# Patient Record
Sex: Male | Born: 1955 | Race: White | Hispanic: No | Marital: Married | Smoking: Former smoker
Health system: Southern US, Community
[De-identification: ages and names within clinical notes are randomized; demographics above are authoritative.]

## PROBLEM LIST (undated history)

## (undated) DIAGNOSIS — T7840XA Allergy, unspecified, initial encounter: Secondary | ICD-10-CM

## (undated) HISTORY — DX: Allergy, unspecified, initial encounter: T78.40XA

## (undated) HISTORY — PX: HERNIA REPAIR: SHX51

---

## 1999-03-02 ENCOUNTER — Observation Stay (HOSPITAL_COMMUNITY): Admission: EM | Admit: 1999-03-02 | Discharge: 1999-03-02 | Payer: Self-pay

## 1999-03-02 ENCOUNTER — Encounter: Payer: Self-pay | Admitting: Emergency Medicine

## 2000-09-15 ENCOUNTER — Emergency Department (HOSPITAL_COMMUNITY): Admission: EM | Admit: 2000-09-15 | Discharge: 2000-09-15 | Payer: Self-pay | Admitting: Emergency Medicine

## 2010-07-19 ENCOUNTER — Emergency Department (HOSPITAL_COMMUNITY)
Admission: EM | Admit: 2010-07-19 | Discharge: 2010-07-19 | Payer: Self-pay | Source: Home / Self Care | Admitting: Emergency Medicine

## 2010-10-21 LAB — CBC
HCT: 41.5 % (ref 39.0–52.0)
Hemoglobin: 13.8 g/dL (ref 13.0–17.0)
MCH: 32.9 pg (ref 26.0–34.0)
MCHC: 33.3 g/dL (ref 30.0–36.0)
MCV: 99 fL (ref 78.0–100.0)
Platelets: 152 10*3/uL (ref 150–400)
RBC: 4.19 MIL/uL — ABNORMAL LOW (ref 4.22–5.81)
RDW: 13.1 % (ref 11.5–15.5)
WBC: 8.9 10*3/uL (ref 4.0–10.5)

## 2010-10-21 LAB — COMPREHENSIVE METABOLIC PANEL
ALT: 9 U/L (ref 0–53)
AST: 18 U/L (ref 0–37)
Albumin: 3.6 g/dL (ref 3.5–5.2)
Alkaline Phosphatase: 50 U/L (ref 39–117)
Potassium: 3.8 mEq/L (ref 3.5–5.1)
Sodium: 137 mEq/L (ref 135–145)
Total Protein: 6.3 g/dL (ref 6.0–8.3)

## 2010-10-21 LAB — DIFFERENTIAL
Basophils Relative: 0 % (ref 0–1)
Eosinophils Absolute: 0.3 10*3/uL (ref 0.0–0.7)
Eosinophils Relative: 3 % (ref 0–5)
Monocytes Absolute: 0.5 10*3/uL (ref 0.1–1.0)
Monocytes Relative: 6 % (ref 3–12)
Neutro Abs: 7 10*3/uL (ref 1.7–7.7)

## 2010-10-21 LAB — URINALYSIS, ROUTINE W REFLEX MICROSCOPIC
Bilirubin Urine: NEGATIVE
Nitrite: NEGATIVE
Specific Gravity, Urine: 1.025 (ref 1.005–1.030)
pH: 5.5 (ref 5.0–8.0)

## 2010-10-21 LAB — LIPASE, BLOOD: Lipase: 22 U/L (ref 11–59)

## 2010-10-21 LAB — SAMPLE TO BLOOD BANK

## 2010-10-21 LAB — HEMOCCULT GUIAC POC 1CARD (OFFICE): Fecal Occult Bld: POSITIVE

## 2011-09-20 ENCOUNTER — Ambulatory Visit (INDEPENDENT_AMBULATORY_CARE_PROVIDER_SITE_OTHER): Payer: BC Managed Care – PPO | Admitting: Physician Assistant

## 2011-09-20 DIAGNOSIS — J069 Acute upper respiratory infection, unspecified: Secondary | ICD-10-CM

## 2011-09-20 DIAGNOSIS — F17200 Nicotine dependence, unspecified, uncomplicated: Secondary | ICD-10-CM

## 2011-09-20 DIAGNOSIS — J301 Allergic rhinitis due to pollen: Secondary | ICD-10-CM

## 2011-09-20 DIAGNOSIS — J45909 Unspecified asthma, uncomplicated: Secondary | ICD-10-CM

## 2011-09-20 DIAGNOSIS — Z720 Tobacco use: Secondary | ICD-10-CM

## 2011-09-20 MED ORDER — ALBUTEROL SULFATE HFA 108 (90 BASE) MCG/ACT IN AERS: 2.0000 | INHALATION_SPRAY | Freq: Four times a day (QID) | RESPIRATORY_TRACT | Status: DC | PRN

## 2011-09-20 MED ORDER — VARENICLINE TARTRATE 0.5 MG PO TABS: ORAL_TABLET | ORAL | Status: DC

## 2011-09-20 MED ORDER — FLUTICASONE PROPIONATE 50 MCG/ACT NA SUSP: 2.0000 | Freq: Every day | NASAL | Status: DC

## 2011-09-20 MED ORDER — MOMETASONE FUROATE 220 MCG/INH IN AEPB: 2.0000 | INHALATION_SPRAY | Freq: Every day | RESPIRATORY_TRACT | Status: DC

## 2011-09-20 MED ORDER — AZITHROMYCIN 250 MG PO TABS: ORAL_TABLET | ORAL | Status: AC

## 2011-09-20 MED ORDER — GUAIFENESIN ER 1200 MG PO TB12: 1.0000 | ORAL_TABLET | Freq: Two times a day (BID) | ORAL | Status: DC | PRN

## 2011-09-20 MED ORDER — ALBUTEROL SULFATE (2.5 MG/3ML) 0.083% IN NEBU: 2.5000 mg | INHALATION_SOLUTION | Freq: Four times a day (QID) | RESPIRATORY_TRACT | Status: DC | PRN

## 2011-09-20 NOTE — Patient Instructions (Signed)
Take medications as prescribed.  Once you are well, continue the Asmanex and Flonase to help prevent the next episode.    Select your quit date at least 7 days in the future.  Start taking the Chantix, and continue smoking, until the quit date.  On the date, quit smoking and continue the CHantix.

## 2011-09-20 NOTE — Progress Notes (Signed)
Addended by: Fernande Bras on: 09/20/2011 11:12 AM   Modules accepted: Orders

## 2011-09-20 NOTE — Progress Notes (Signed)
  Subjective:    Patient ID: Craig Potts, male    DOB: 1955-11-07, 56 y.o.   MRN: [id number]  HPI The patient presents with illness since yesterday. He complains of sinus congestion drainage and cough. He has a history of allergic rhinitis and asthma. Has run out of both Asmanex and Ventolin inhalers. He continues to smoke, is ready to quit.  Note ear pain or pressure. Mild sore throat. No fever or chills. No GI or GU complaints.  His primary concern is that this will "develop into bronchitis. When I get bronchitis I often end up in the hospital with pneumonia."   Review of Systems As above, otherwise negative.    Objective:   Physical Exam  Vitals reviewed. Constitutional: He is oriented to person, place, and time. Vital signs are normal. He appears well-developed and well-nourished. No distress.  HENT:  Head: Normocephalic and atraumatic.  Right Ear: Hearing normal.  Left Ear: Hearing normal.  Eyes: EOM are normal. Pupils are equal, round, and reactive to light.  Neck: Normal range of motion. Neck supple. No thyromegaly present.  Cardiovascular: Normal rate, regular rhythm and normal heart sounds.   Pulses:      Radial pulses are 2+ on the right side, and 2+ on the left side.  Pulmonary/Chest: Effort normal and breath sounds normal.  Lymphadenopathy:       Head (right side): No tonsillar, no preauricular, no posterior auricular and no occipital adenopathy present.       Head (left side): No tonsillar, no preauricular, no posterior auricular and no occipital adenopathy present.    He has no cervical adenopathy.       Right: No supraclavicular adenopathy present.       Left: No supraclavicular adenopathy present.  Neurological: He is alert and oriented to person, place, and time. No sensory deficit.  Skin: Skin is warm, dry and intact. No rash noted. No cyanosis or erythema. Nails show no clubbing.  Psychiatric: He has a normal mood and affect.      Assessment & Plan:  1.  URI. While it is very likely his illness is viral, he is at increased risk due to his smoking. Z-Pak. Guaifenesin. When necessary albuterol.  2. Asthma. Back on Asmanex. Albuterol when necessary for rescue. Encouraged him to stay on his maintenance product  And not run out.  3. Allergic rhinitis. Flonase as above.  4. Tobacco abuse. He is ready to quit. Prescription for Chantix.

## 2012-01-31 ENCOUNTER — Ambulatory Visit (INDEPENDENT_AMBULATORY_CARE_PROVIDER_SITE_OTHER): Payer: BC Managed Care – PPO | Admitting: Emergency Medicine

## 2012-01-31 VITALS — BP 120/70 | HR 78 | Temp 98.6°F | Resp 16 | Ht 70.0 in | Wt 154.0 lb

## 2012-01-31 DIAGNOSIS — J029 Acute pharyngitis, unspecified: Secondary | ICD-10-CM

## 2012-01-31 DIAGNOSIS — Z720 Tobacco use: Secondary | ICD-10-CM

## 2012-01-31 DIAGNOSIS — J218 Acute bronchiolitis due to other specified organisms: Secondary | ICD-10-CM

## 2012-01-31 DIAGNOSIS — J018 Other acute sinusitis: Secondary | ICD-10-CM

## 2012-01-31 DIAGNOSIS — J40 Bronchitis, not specified as acute or chronic: Secondary | ICD-10-CM

## 2012-01-31 MED ORDER — AMOXICILLIN-POT CLAVULANATE 875-125 MG PO TABS: 1.0000 | ORAL_TABLET | Freq: Two times a day (BID) | ORAL | Status: AC

## 2012-01-31 MED ORDER — VARENICLINE TARTRATE 0.5 MG PO TABS: ORAL_TABLET | ORAL | Status: DC

## 2012-01-31 MED ORDER — MOMETASONE FUROATE 220 MCG/INH IN AEPB: 2.0000 | INHALATION_SPRAY | Freq: Every day | RESPIRATORY_TRACT | Status: DC

## 2012-01-31 MED ORDER — HYDROCOD POLST-CHLORPHEN POLST 10-8 MG/5ML PO LQCR: 5.0000 mL | Freq: Two times a day (BID) | ORAL | Status: DC | PRN

## 2012-01-31 NOTE — Progress Notes (Signed)
     Patient Name: Craig Potts Date of Birth: 04-25-1956 Medical Record Number: [id number] Gender: male Date of Encounter: 01/31/2012  History of Present Illness:  [name] is a 56 y.o. very pleasant male patient who presents with the following:  Recurrent infection.  Has marked post nasal drainage and a foul taste in mouth.  Profuse nasal discharge and congestion.  Cough not productive.  No fever or chills.  No nausea or vomiting   There is no problem list on file for this patient.  Past Medical History  Diagnosis Date  . Allergy   . Asthma    Past Surgical History  Procedure Date  . Hernia repair    History  Substance Use Topics  . Smoking status: Current Everyday Smoker -- 1.0 packs/day for 10 years    Types: Cigarettes  . Smokeless tobacco: Not on file  . Alcohol Use: Not on file   Family History  Problem Relation Age of Onset  . Stroke Father    No Known Allergies  Medication list has been reviewed and updated.  Prior to Admission medications   Medication Sig Start Date End Date Taking? Authorizing Provider  albuterol (PROVENTIL HFA;VENTOLIN HFA) 108 (90 BASE) MCG/ACT inhaler Inhale 2 puffs into the lungs every 6 (six) hours as needed for wheezing. 09/20/11 09/19/12 Yes Chelle S Eulis Salazar, PA-C  fluticasone (FLONASE) 50 MCG/ACT nasal spray Place 2 sprays into the nose daily. 09/20/11 09/19/12 Yes Chelle S Traycen Goyer, PA-C  mometasone (ASMANEX 60 METERED DOSES) 220 MCG/INH inhaler Inhale 2 puffs into the lungs daily. 09/20/11 09/19/12 Yes Chelle S Leyda Vanderwerf, PA-C  Guaifenesin (MUCINEX MAXIMUM STRENGTH) 1200 MG TB12 Take 1 tablet (1,200 mg total) by mouth every 12 (twelve) hours as needed. 09/20/11   Chelle Tessa Lerner, PA-C  varenicline (CHANTIX) 0.5 MG tablet Take as directed. 09/20/11   Fernande Bras, PA-C    Review of Systems:  As per HPI, otherwise negative.    Physical Examination: Filed Vitals:   01/31/12 1123  BP: 120/70  Pulse: 78  Temp: 98.6 F (37 C)    Resp: 16   Filed Vitals:   01/31/12 1123  Height: 5\' 10"  (1.778 m)  Weight: 154 lb (69.854 kg)   Body mass index is 22.10 kg/(m^2). Ideal Body Weight: Weight in (lb) to have BMI = 25: 173.9   GEN: WDWN, NAD, Non-toxic, A & O x 3 HEENT: Atraumatic, Normocephalic. Neck supple. No masses, No LAD. Ears and Nose: No external deformity. CV: RRR, No M/G/R. No JVD. No thrill. No extra heart sounds. PULM: CTA B, crackles, rhonchi. No retractions. No resp. distress. No accessory muscle use.  Diffuse wheezing ABD: S, NT, ND, +BS. No rebound. No HSM. EXTR: No c/c/e NEURO Normal gait.  PSYCH: Normally interactive. Conversant. Not depressed or anxious appearing.  Calm demeanor.    Assessment and Plan: Bronchitis Sinusitis Stop smoking   chantix augmentin  Carmelina Dane, MD

## 2012-04-02 ENCOUNTER — Other Ambulatory Visit: Payer: Self-pay | Admitting: Physician Assistant

## 2013-06-01 ENCOUNTER — Ambulatory Visit: Payer: BC Managed Care – PPO

## 2013-06-01 ENCOUNTER — Ambulatory Visit (INDEPENDENT_AMBULATORY_CARE_PROVIDER_SITE_OTHER): Payer: BC Managed Care – PPO | Admitting: Internal Medicine

## 2013-06-01 VITALS — BP 104/62 | HR 60 | Temp 98.7°F | Resp 12 | Ht 69.5 in | Wt 150.4 lb

## 2013-06-01 DIAGNOSIS — M79609 Pain in unspecified limb: Secondary | ICD-10-CM

## 2013-06-01 DIAGNOSIS — J441 Chronic obstructive pulmonary disease with (acute) exacerbation: Secondary | ICD-10-CM

## 2013-06-01 DIAGNOSIS — Z23 Encounter for immunization: Secondary | ICD-10-CM

## 2013-06-01 DIAGNOSIS — S61002A Unspecified open wound of left thumb without damage to nail, initial encounter: Secondary | ICD-10-CM

## 2013-06-01 DIAGNOSIS — M79645 Pain in left finger(s): Secondary | ICD-10-CM

## 2013-06-01 DIAGNOSIS — F17200 Nicotine dependence, unspecified, uncomplicated: Secondary | ICD-10-CM

## 2013-06-01 DIAGNOSIS — S61209A Unspecified open wound of unspecified finger without damage to nail, initial encounter: Secondary | ICD-10-CM

## 2013-06-01 DIAGNOSIS — J218 Acute bronchiolitis due to other specified organisms: Secondary | ICD-10-CM

## 2013-06-01 MED ORDER — MOMETASONE FUROATE 220 MCG/INH IN AEPB: 2.0000 | INHALATION_SPRAY | Freq: Every day | RESPIRATORY_TRACT | Status: DC

## 2013-06-01 MED ORDER — FLUTICASONE PROPIONATE 50 MCG/ACT NA SUSP: 2.0000 | Freq: Every day | NASAL | Status: AC

## 2013-06-01 MED ORDER — VARENICLINE TARTRATE 0.5 MG PO TABS: 0.5000 mg | ORAL_TABLET | Freq: Two times a day (BID) | ORAL | Status: DC

## 2013-06-01 MED ORDER — ALBUTEROL SULFATE HFA 108 (90 BASE) MCG/ACT IN AERS: 2.0000 | INHALATION_SPRAY | Freq: Four times a day (QID) | RESPIRATORY_TRACT | Status: DC

## 2013-06-01 MED ORDER — CEPHALEXIN 500 MG PO CAPS: 500.0000 mg | ORAL_CAPSULE | Freq: Two times a day (BID) | ORAL | Status: DC

## 2013-06-01 NOTE — Patient Instructions (Addendum)
WOUND CARE Please return in 7 days to have your stitches/staples removed or sooner if you have concerns. Marland Kitchen Keep area clean and dry for 24 hours. Do not remove bandage, if applied. . After 24 hours, remove bandage and wash wound gently with mild soap and warm water. Reapply a new bandage after cleaning wound, if directed. . Continue daily cleansing with soap and water until stitches/staples are removed. . Do not apply any ointments or creams to the wound while stitches/staples are in place, as this may cause delayed healing. . Notify the office if you experience any of the following signs of infection: Swelling, redness, pus drainage, streaking, fever >101.0 F . Notify the office if you experience excessive bleeding that does not stop after 15-20 minutes of constant, firm pressure.  Smoking Cessation Quitting smoking is important to your health and has many advantages. However, it is not always easy to quit since nicotine is a very addictive drug. Often times, people try 3 times or more before being able to quit. This document explains the best ways for you to prepare to quit smoking. Quitting takes hard work and a lot of effort, but you can do it. ADVANTAGES OF QUITTING SMOKING  You will live longer, feel better, and live better.  Your body will feel the impact of quitting smoking almost immediately.  Within 20 minutes, blood pressure decreases. Your pulse returns to its normal level.  After 8 hours, carbon monoxide levels in the blood return to normal. Your oxygen level increases.  After 24 hours, the chance of having a heart attack starts to decrease. Your breath, hair, and body stop smelling like smoke.  After 48 hours, damaged nerve endings begin to recover. Your sense of taste and smell improve.  After 72 hours, the body is virtually free of nicotine. Your bronchial tubes relax and breathing becomes easier.  After 2 to 12 weeks, lungs can hold more air. Exercise becomes  easier and circulation improves.  The risk of having a heart attack, stroke, cancer, or lung disease is greatly reduced.  After 1 year, the risk of coronary heart disease is cut in half.  After 5 years, the risk of stroke falls to the same as a nonsmoker.  After 10 years, the risk of lung cancer is cut in half and the risk of other cancers decreases significantly.  After 15 years, the risk of coronary heart disease drops, usually to the level of a nonsmoker.  If you are pregnant, quitting smoking will improve your chances of having a healthy baby.  The people you live with, especially any children, will be healthier.  You will have extra money to spend on things other than cigarettes. QUESTIONS TO THINK ABOUT BEFORE ATTEMPTING TO QUIT You may want to talk about your answers with your caregiver.  Why do you want to quit?  If you tried to quit in the past, what helped and what did not?  What will be the most difficult situations for you after you quit? How will you plan to handle them?  Who can help you through the tough times? Your family? Friends? A caregiver?  What pleasures do you get from smoking? What ways can you still get pleasure if you quit? Here are some questions to ask your caregiver:  How can you help me to be successful at quitting?  What medicine do you think would be best for me and how should I take it?  What should I do if I need more  help?  What is smoking withdrawal like? How can I get information on withdrawal? GET READY  Set a quit date.  Change your environment by getting rid of all cigarettes, ashtrays, matches, and lighters in your home, car, or work. Do not let people smoke in your home.  Review your past attempts to quit. Think about what worked and what did not. GET SUPPORT AND ENCOURAGEMENT You have a better chance of being successful if you have help. You can get support in many ways.  Tell your family, friends, and co-workers that you are  going to quit and need their support. Ask them not to smoke around you.  Get individual, group, or telephone counseling and support. Programs are available at [employer] and health centers. Call your local health department for information about programs in your area.  Spiritual beliefs and practices may help some smokers quit.  Download a "quit meter" on your computer to keep track of quit statistics, such as how long you have gone without smoking, cigarettes not smoked, and money saved.  Get a self-help book about quitting smoking and staying off of tobacco. LEARN NEW SKILLS AND BEHAVIORS  Distract yourself from urges to smoke. Talk to someone, go for a walk, or occupy your time with a task.  Change your normal routine. Take a different route to work. Drink tea instead of coffee. Eat breakfast in a different place.  Reduce your stress. Take a hot bath, exercise, or read a book.  Plan something enjoyable to do every day. Reward yourself for not smoking.  Explore interactive web-based programs that specialize in helping you quit. GET MEDICINE AND USE IT CORRECTLY Medicines can help you stop smoking and decrease the urge to smoke. Combining medicine with the above behavioral methods and support can greatly increase your chances of successfully quitting smoking.  Nicotine replacement therapy helps deliver nicotine to your body without the negative effects and risks of smoking. Nicotine replacement therapy includes nicotine gum, lozenges, inhalers, nasal sprays, and skin patches. Some may be available over-the-counter and others require a prescription.  Antidepressant medicine helps people abstain from smoking, but how this works is unknown. This medicine is available by prescription.  Nicotinic receptor partial agonist medicine simulates the effect of nicotine in your brain. This medicine is available by prescription. Ask your caregiver for advice about which medicines to use and how  to use them based on your health history. Your caregiver will tell you what side effects to look out for if you choose to be on a medicine or therapy. Carefully read the information on the package. Do not use any other product containing nicotine while using a nicotine replacement product.  RELAPSE OR DIFFICULT SITUATIONS Most relapses occur within the first 3 months after quitting. Do not be discouraged if you start smoking again. Remember, most people try several times before finally quitting. You may have symptoms of withdrawal because your body is used to nicotine. You may crave cigarettes, be irritable, feel very hungry, cough often, get headaches, or have difficulty concentrating. The withdrawal symptoms are only temporary. They are strongest when you first quit, but they will go away within 10 14 days. To reduce the chances of relapse, try to:  Avoid drinking alcohol. Drinking lowers your chances of successfully quitting.  Reduce the amount of caffeine you consume. Once you quit smoking, the amount of caffeine in your body increases and can give you symptoms, such as a rapid heartbeat, sweating, and anxiety.  Avoid smokers  because they can make you want to smoke.  Do not let weight gain distract you. Many smokers will gain weight when they quit, usually less than 10 pounds. Eat a healthy diet and stay active. You can always lose the weight gained after you quit.  Find ways to improve your mood other than smoking. FOR MORE INFORMATION  www.smokefree.gov  Document Released: 07/21/2001 Document Revised: 01/26/2012 Document Reviewed: 11/05/2011 [facility] Patient Information 2014 [place], [place].

## 2013-06-01 NOTE — Progress Notes (Signed)
  Subjective:    Patient ID: Craig Potts, male    DOB: 1956-08-06, 57 y.o.   MRN: [id number]  HPI At home and cut dorsal left thumb on metal edge. Mild pain, no function loss, no purulence seen.  Need tetanus up date. Wants to quit smoking, has asthma,   Review of Systems     Objective:   Physical Exam  Vitals reviewed. Constitutional: He is oriented to person, place, and time. He appears well-developed and well-nourished. No distress.  HENT:  Head: Normocephalic.  Eyes: EOM are normal. No scleral icterus.  Pulmonary/Chest: Effort normal.  Musculoskeletal:       Right hand: Normal.       Left hand: He exhibits tenderness, bony tenderness and laceration. He exhibits normal range of motion, normal two-point discrimination, normal capillary refill, no deformity and no swelling. Normal sensation noted. Normal strength noted. He exhibits no thumb/finger opposition.       Hands: No pain with resisted thumb extention NMVS intact  Neurological: He is alert and oriented to person, place, and time. He has normal strength. No cranial nerve deficit or sensory deficit.  Skin: Ecchymosis and laceration noted. Rash is not pustular.   [facility] reading (PRIMARY) by  Dr Perrin Maltese no bony injury or FB seen.  Wound repair by Ms. Egan PAc.         Assessment & Plan:  Wound care/F/up Quit smoking plan/Chantix best trial for him

## 2013-06-01 NOTE — Progress Notes (Signed)
Procedure Note: Verbal consent obtained from the patient.  Local anesthesia with 1 cc Lidocaine 1% with epinephrine.  Wound scrubbed with soap and water.  Wound explored.  No foreign bodies or deep structure injury noted.  Wound closed with #5 sutures of 5-0 ethilon (#3 HM, #2 SI).  Area cleansed and dressed.  Pt tolerated very well.  Discussed wound care.  Anticipate suture removal in 5 days.

## 2013-06-10 ENCOUNTER — Ambulatory Visit (INDEPENDENT_AMBULATORY_CARE_PROVIDER_SITE_OTHER): Payer: BC Managed Care – PPO | Admitting: Physician Assistant

## 2013-06-10 VITALS — BP 108/74 | HR 64 | Temp 98.1°F | Resp 16 | Ht 69.5 in | Wt 150.0 lb

## 2013-06-10 DIAGNOSIS — S61402D Unspecified open wound of left hand, subsequent encounter: Secondary | ICD-10-CM

## 2013-06-10 DIAGNOSIS — Z5189 Encounter for other specified aftercare: Secondary | ICD-10-CM

## 2013-06-10 NOTE — Progress Notes (Signed)
  Subjective:    Patient ID: Craig Potts, male    DOB: 1955-10-16, 57 y.o.   MRN: [id number]  Suture / Staple Removal   57 year old male presents for suture removal. DOI 06/01/13. Wound is of left thumb. Doing well without issues or complaints. Denies erythema, warmth, or drainage.     Review of Systems  Constitutional: Negative for fever and chills.  Gastrointestinal: Negative for nausea and vomiting.  Skin: Positive for wound. Negative for color change.       Objective:   Physical Exam  Constitutional: He is oriented to person, place, and time. He appears well-developed and well-nourished.  HENT:  Head: Normocephalic and atraumatic.  Right Ear: External ear normal.  Left Ear: External ear normal.  Cardiovascular: Normal rate.   Pulmonary/Chest: Effort normal.  Neurological: He is alert and oriented to person, place, and time.  Skin:  Wound on left thumb well healing without erythema, warmth, or drainage.   Psychiatric: He has a normal mood and affect. His behavior is normal. Judgment and thought content normal.    Sutures removed without difficulty      Assessment & Plan:  Wound, open, hand with or without fingers with complication, left, subsequent encounter  Wound well healing Follow up as needed.

## 2013-08-01 ENCOUNTER — Other Ambulatory Visit: Payer: Self-pay

## 2013-08-01 DIAGNOSIS — J441 Chronic obstructive pulmonary disease with (acute) exacerbation: Secondary | ICD-10-CM

## 2013-08-01 DIAGNOSIS — J218 Acute bronchiolitis due to other specified organisms: Secondary | ICD-10-CM

## 2013-08-01 DIAGNOSIS — S61002A Unspecified open wound of left thumb without damage to nail, initial encounter: Secondary | ICD-10-CM

## 2013-08-01 DIAGNOSIS — M79645 Pain in left finger(s): Secondary | ICD-10-CM

## 2013-08-01 DIAGNOSIS — Z23 Encounter for immunization: Secondary | ICD-10-CM

## 2013-08-01 MED ORDER — MOMETASONE FUROATE 220 MCG/INH IN AEPB: 2.0000 | INHALATION_SPRAY | Freq: Every day | RESPIRATORY_TRACT | Status: DC

## 2013-08-01 NOTE — Telephone Encounter (Signed)
Pharm reqs 90 day supply.

## 2013-08-31 ENCOUNTER — Other Ambulatory Visit: Payer: Self-pay | Admitting: Internal Medicine

## 2016-10-09 ENCOUNTER — Ambulatory Visit: Payer: BLUE CROSS/BLUE SHIELD | Admitting: Diagnostic Neuroimaging

## 2019-10-27 ENCOUNTER — Ambulatory Visit: Payer: Self-pay | Attending: Internal Medicine

## 2019-10-27 DIAGNOSIS — Z23 Encounter for immunization: Secondary | ICD-10-CM

## 2019-10-27 NOTE — Progress Notes (Signed)
   Covid-19 Vaccination Clinic  Name:  Craig Potts    MRN: [id number] DOB: 08/02/1956  10/27/2019  Craig Potts was observed post Covid-19 immunization for 15 minutes without incident. He was provided with Vaccine Information Sheet and instruction to access the V-Safe system.   Craig Potts was instructed to call 911 with any severe reactions post vaccine: Marland Kitchen Difficulty breathing  . Swelling of face and throat  . A fast heartbeat  . A bad rash all over body  . Dizziness and weakness   Immunizations Administered    Name Date Dose VIS Date Route   Pfizer COVID-19 Vaccine 10/27/2019 10:41 AM 0.3 mL 07/21/2019 Intramuscular   Manufacturer: [employer], [employer]   Lot: [id number]   NDC: [address]-9

## 2019-11-21 ENCOUNTER — Ambulatory Visit: Payer: Self-pay | Attending: Internal Medicine

## 2019-11-21 DIAGNOSIS — Z23 Encounter for immunization: Secondary | ICD-10-CM

## 2019-11-21 NOTE — Progress Notes (Signed)
   Covid-19 Vaccination Clinic  Name:  Craig Potts    MRN: [id number] DOB: 12-15-1955  11/21/2019  Craig Potts was observed post Covid-19 immunization for 15 minutes without incident. He was provided with Vaccine Information Sheet and instruction to access the V-Safe system.   Craig Potts was instructed to call 911 with any severe reactions post vaccine: Marland Kitchen Difficulty breathing  . Swelling of face and throat  . A fast heartbeat  . A bad rash all over body  . Dizziness and weakness   Immunizations Administered    Name Date Dose VIS Date Route   Pfizer COVID-19 Vaccine 11/21/2019 11:44 AM 0.3 mL 07/21/2019 Intramuscular   Manufacturer: [employer], [employer]   Lot: [id number]   NDC: [address]-8

## 2020-01-18 ENCOUNTER — Other Ambulatory Visit: Payer: Self-pay | Admitting: Internal Medicine

## 2020-01-18 DIAGNOSIS — F17200 Nicotine dependence, unspecified, uncomplicated: Secondary | ICD-10-CM

## 2020-02-05 ENCOUNTER — Ambulatory Visit
Admission: RE | Admit: 2020-02-05 | Discharge: 2020-02-05 | Disposition: A | Discharge status: Home / Self Care | Payer: Self-pay | Source: Ambulatory Visit | Attending: Internal Medicine | Admitting: Internal Medicine

## 2020-02-05 DIAGNOSIS — F17200 Nicotine dependence, unspecified, uncomplicated: Secondary | ICD-10-CM

## 2020-03-26 ENCOUNTER — Ambulatory Visit: Payer: Self-pay | Admitting: Cardiology

## 2020-04-16 ENCOUNTER — Ambulatory Visit: Payer: Self-pay | Admitting: Cardiology

## 2020-05-16 ENCOUNTER — Ambulatory Visit: Payer: BC Managed Care – PPO

## 2020-05-16 ENCOUNTER — Other Ambulatory Visit: Payer: Self-pay

## 2020-05-16 ENCOUNTER — Ambulatory Visit: Payer: BC Managed Care – PPO | Admitting: Cardiology

## 2020-05-16 ENCOUNTER — Encounter: Payer: Self-pay | Admitting: Cardiology

## 2020-05-16 VITALS — BP 117/72 | HR 96 | Resp 16 | Ht 70.0 in | Wt 183.0 lb

## 2020-05-16 DIAGNOSIS — I4891 Unspecified atrial fibrillation: Secondary | ICD-10-CM | POA: Insufficient documentation

## 2020-05-16 DIAGNOSIS — I480 Paroxysmal atrial fibrillation: Secondary | ICD-10-CM

## 2020-05-16 DIAGNOSIS — I2510 Atherosclerotic heart disease of native coronary artery without angina pectoris: Secondary | ICD-10-CM

## 2020-05-16 NOTE — Progress Notes (Signed)
Patient referred by [name], MD for atrial fibtillation  Subjective:   Craig Potts, male    DOB: [birth year], 64 y.o.   MRN: [id number]   Chief Complaint  Patient presents with   Coronary Artery Disease   Atrial Fibrillation   New Patient (Initial Visit)     HPI  63 y.o. Caucasian male with new diagnosis atrial fibrillation.  Patient is a retired Administrator.  He has a long history of tobacco and alcohol dependence, now has been sober-quit smoking in 2014 and alcohol in September 2021.  At baseline, he does not have any chest pain, shortness of breath symptoms with regular walking activity.  She weeks ago, he got winded while playing ping-pong.  This led to a PCP visit, the EKG showed A. fib.  He also underwent CT chest for lung cancer screening, which showed coronary atherosclerosis.  Currently, patient does not have any symptoms.  Past Medical History:  Diagnosis Date   Allergy    Asthma      Past Surgical History:  Procedure Laterality Date   HERNIA REPAIR       Social History   Tobacco Use  Smoking Status Former Smoker   Packs/day: 2.00   Years: 50.00   Pack years: 100.00   Types: Cigarettes   Quit date: 2014   Years since quitting: 7.7  Smokeless Tobacco Never Used  .  Social History   Substance and Sexual Activity  Alcohol Use No     Family History  Problem Relation Age of Onset   Stroke Father      Current Outpatient Medications on File Prior to Visit  Medication Sig Dispense Refill   fluticasone (FLONASE) 50 MCG/ACT nasal spray Place 2 sprays into the nose daily. 16 g 6   PROAIR HFA 108 (90 BASE) MCG/ACT inhaler INHALE 2 PUFFS INTO THE LUNGS 4 (FOUR) TIMES DAILY. 3 Inhaler 0   doxycycline (PERIOSTAT) 20 MG tablet Take 1 tablet by mouth 2 (two) times daily.     mometasone (ASMANEX 60 METERED DOSES) 220 MCG/INH inhaler Inhale 2 puffs into the lungs daily. 3 Inhaler 2   QC LO-DOSE ASPIRIN 81 MG EC tablet daily.      rosuvastatin (CRESTOR) 5 MG tablet Take 5 mg by mouth at bedtime.     traZODone (DESYREL) 100 MG tablet Take 100-150 mg by mouth at bedtime as needed.     triamcinolone cream (KENALOG) 0.1 % Apply 1 application topically 2 (two) times daily.     No current facility-administered medications on file prior to visit.    Cardiovascular and other pertinent studies:  EKG 05/16/2020: Sinus rhythm 98 bpm  RSR(V1) -nondiagnostic  EKG 05/13/2020: Atrial fibrillation 100 bpm  EKG 01/18/2020: Sinus rhythm 60 bpm.  Frequent PACs.  CT chest lung cancer screening 02/05/2020: 1. Lung-RADS 3S, probably benign findings. Short-term follow-up in 6 months is recommended with repeat low-dose chest CT without contrast (please use the following order, "CT CHEST LCS NODULE FOLLOW-UP W/O CM"). 2. The "S" modifier above refers to potentially clinically significant non lung cancer related findings. Specifically, there is aortic atherosclerosis, in addition to left anterior descending coronary artery disease. Please note that although the presence of coronary artery calcium documents the presence of coronary artery disease, the severity of this disease and any potential stenosis cannot be assessed on this non-gated CT examination. Assessment for potential risk factor modification, dietary therapy or pharmacologic therapy may be warranted, if clinically indicated. 3. Mild diffuse bronchial  wall thickening with very mild centrilobular and paraseptal emphysema; imaging findings suggestive of underlying COPD.   Recent labs: 03/27/2020: Glucose 79, BUN/Cr 6/0.8. EGFR 93. HbA1C N/A Chol 141, TG 75, HDL 49, LDL 77 TSH 1.0 normal    Review of Systems  Cardiovascular: Positive for dyspnea on exertion. Negative for chest pain, leg swelling, palpitations and syncope.         Vitals:   05/16/20 1358  BP: 117/72  Pulse: 96  Resp: 16  SpO2: 97%     Body mass index is 26.26 kg/m. Filed Weights    05/16/20 1358  Weight: 183 lb (83 kg)     Objective:   Physical Exam Vitals and nursing note reviewed.  Constitutional:      General: He is not in acute distress. Neck:     Vascular: No JVD.  Cardiovascular:     Rate and Rhythm: Normal rate and regular rhythm.     Heart sounds: Normal heart sounds. No murmur heard.   Pulmonary:     Effort: Pulmonary effort is normal.     Breath sounds: Normal breath sounds. No wheezing or rales.         Assessment & Recommendations:   64 y.o. Caucasian male with prior h/o tobacco and alcohol dependence, coronary atherosclerosis (CT Chest 03/2020), paroxysmal atrial fibrillation.  Paroxysmal Afib: Currently in sinus rhythm CHA2DS2VASc score 1 t the most-considering coronary atherosclerosis, annual stroke risk 0.6%. Recommend Aspirin 81 mg daily for now for coronary atherosclerosis I will obtain exercise nuclear stress test and echocardiogram. I will also obtain cardiac telemetry monitoring to assess fib burden. After that, will discuss rhythm control therapy.  Coronary atherosclerosis: No angina symptoms at this time. Dyspnea symptoms probably related to Afib. Stress test will evaluate for ischemia.  Continue Aspirin, staitn  Thank you for referring the patient to [place]. Please feel free to contact with any questions.   Nigel Mormon, MD Pager: [phone] Office: [phone]

## 2020-05-22 ENCOUNTER — Ambulatory Visit: Payer: BC Managed Care – PPO

## 2020-05-22 ENCOUNTER — Other Ambulatory Visit: Payer: Self-pay

## 2020-05-22 DIAGNOSIS — I480 Paroxysmal atrial fibrillation: Secondary | ICD-10-CM

## 2020-05-31 ENCOUNTER — Other Ambulatory Visit (HOSPITAL_COMMUNITY)
Admission: RE | Admit: 2020-05-31 | Discharge: 2020-05-31 | Disposition: A | Discharge status: Home / Self Care | Payer: BC Managed Care – PPO | Source: Ambulatory Visit | Attending: Cardiology | Admitting: Cardiology

## 2020-05-31 DIAGNOSIS — Z20822 Contact with and (suspected) exposure to covid-19: Secondary | ICD-10-CM | POA: Insufficient documentation

## 2020-05-31 DIAGNOSIS — Z01812 Encounter for preprocedural laboratory examination: Secondary | ICD-10-CM | POA: Diagnosis present

## 2020-05-31 LAB — SARS CORONAVIRUS 2 (TAT 6-24 HRS): SARS Coronavirus 2: NEGATIVE

## 2020-06-03 ENCOUNTER — Other Ambulatory Visit: Payer: Self-pay

## 2020-06-03 ENCOUNTER — Ambulatory Visit: Payer: BC Managed Care – PPO

## 2020-06-03 DIAGNOSIS — I480 Paroxysmal atrial fibrillation: Secondary | ICD-10-CM

## 2020-06-08 ENCOUNTER — Ambulatory Visit: Payer: BC Managed Care – PPO | Attending: Internal Medicine

## 2020-06-08 DIAGNOSIS — Z23 Encounter for immunization: Secondary | ICD-10-CM

## 2020-06-08 NOTE — Progress Notes (Signed)
   Covid-19 Vaccination Clinic  Name:  Craig Potts    MRN: [id number] DOB: 18-Nov-1955  06/08/2020  [name] was observed post Covid-19 immunization for 15 minutes without incident. He was provided with Vaccine Information Sheet and instruction to access the V-Safe system.   [name] was instructed to call 911 with any severe reactions post vaccine: Marland Kitchen Difficulty breathing  . Swelling of face and throat  . A fast heartbeat  . A bad rash all over body  . Dizziness and weakness

## 2020-06-14 ENCOUNTER — Encounter: Payer: Self-pay | Admitting: Cardiology

## 2020-06-14 ENCOUNTER — Ambulatory Visit: Payer: BC Managed Care – PPO | Admitting: Cardiology

## 2020-06-14 ENCOUNTER — Other Ambulatory Visit: Payer: Self-pay

## 2020-06-14 VITALS — BP 108/68 | HR 75 | Resp 16 | Ht 70.0 in | Wt 183.0 lb

## 2020-06-14 DIAGNOSIS — I2510 Atherosclerotic heart disease of native coronary artery without angina pectoris: Secondary | ICD-10-CM

## 2020-06-14 DIAGNOSIS — I480 Paroxysmal atrial fibrillation: Secondary | ICD-10-CM

## 2020-06-14 NOTE — Progress Notes (Signed)
Patient referred by [name], MD for atrial fibtillation  Subjective:   Craig Potts, male    DOB: 03/30/1956, 64 y.o.   MRN: [id number]   Chief Complaint  Patient presents with  . Paroxysmal atrial fibrillation  . Follow-up    4 week     HPI  64 y.o. Caucasian male with prior h/o tobacco and alcohol dependence, coronary atherosclerosis (CT Chest 03/2020), symptomatic paroxysmal atrial fibrillation.  Patient is doing well, and has only occasional shortness of breath.  Initial consultation HPI 05/2020: Patient is a retired Administrator.  He has a long history of tobacco and alcohol dependence, now has been sober-quit smoking in 2014 and alcohol in September 2021.  At baseline, he does not have any chest pain, shortness of breath symptoms with regular walking activity.  She weeks ago, he got winded while playing ping-pong.  This led to a PCP visit, the EKG showed A. fib.  He also underwent CT chest for lung cancer screening, which showed coronary atherosclerosis.  Currently, patient does not have any symptoms.    Current Outpatient Medications on File Prior to Visit  Medication Sig Dispense Refill  . doxycycline (PERIOSTAT) 20 MG tablet Take 1 tablet by mouth 2 (two) times daily.    . fluticasone (FLONASE) 50 MCG/ACT nasal spray Place 2 sprays into the nose daily. 16 g 6  . mometasone (ASMANEX 60 METERED DOSES) 220 MCG/INH inhaler Inhale 2 puffs into the lungs daily. 3 Inhaler 2  . PROAIR HFA 108 (90 BASE) MCG/ACT inhaler INHALE 2 PUFFS INTO THE LUNGS 4 (FOUR) TIMES DAILY. 3 Inhaler 0  . QC LO-DOSE ASPIRIN 81 MG EC tablet daily.    . rosuvastatin (CRESTOR) 5 MG tablet Take 5 mg by mouth at bedtime.    . traZODone (DESYREL) 100 MG tablet Take 100-150 mg by mouth at bedtime as needed.    . triamcinolone cream (KENALOG) 0.1 % Apply 1 application topically 2 (two) times daily.     No current facility-administered medications on file prior to visit.    Cardiovascular and  other pertinent studies:  Exercise Sestamibi Stress Test 06/03/2020: Normal ECG stress. The patient exercised for 4 minutes and 59 seconds of a Bruce protocol, achieving approximately 7 METs.  Exercise capacity is reduced.  Myocardial perfusion is normal. Mildly enlarged left ventricle in both rest and stress.  TID is normal. Stress LV EF is normal 67%.  No previous exam available for comparison. Low risk.   Echocardiogram 05/17/2020:  1. Left ventricle cavity is normal in size and wall thickness. Normal  global wall motion. Normal LV systolic function with EF 59%. Normal  diastolic filling pattern.  2. Moderate (Grade II) mitral regurgitation.  3. Mild tricuspid regurgitation.  4. No evidence of pulmonary hypertension  Mobile cardiac telemetry 13 days 05/16/2020 - 05/30/2020: Dominant rhythm: Sinus HR 197-69 bpm. Avg HR 42 bpm. 1.1% isolated SVE, <1% couplet/triplets. 1 episode of NSVT at 197 bpm for 5 beats <1% isolated VE, couplet/triplets. Atrial fibrillation/atrial flutter occurred for 1%, HR 66-174 bpm. Longest episode lasted for 56 min 12 sec, avg HR 111 bpm. 4 patient triggered events correlated with Afib/flutter was detected within 45 seconds of patient symptoms No SVT/high grade AV block, sinus pause >3sec noted.  EKG 05/16/2020: Sinus rhythm 98 bpm  RSR(V1) -nondiagnostic  EKG 05/13/2020: Atrial fibrillation 100 bpm  EKG 01/18/2020: Sinus rhythm 60 bpm.  Frequent PACs.  CT chest lung cancer screening 02/05/2020: 1. Lung-RADS 3S, probably benign  findings. Short-term follow-up in 6 months is recommended with repeat low-dose chest CT without contrast (please use the following order, "CT CHEST LCS NODULE FOLLOW-UP W/O CM"). 2. The "S" modifier above refers to potentially clinically significant non lung cancer related findings. Specifically, there is aortic atherosclerosis, in addition to left anterior descending coronary artery disease. Please note that although the  presence of coronary artery calcium documents the presence of coronary artery disease, the severity of this disease and any potential stenosis cannot be assessed on this non-gated CT examination. Assessment for potential risk factor modification, dietary therapy or pharmacologic therapy may be warranted, if clinically indicated. 3. Mild diffuse bronchial wall thickening with very mild centrilobular and paraseptal emphysema; imaging findings suggestive of underlying COPD.   Recent labs: 03/27/2020: Glucose 79, BUN/Cr 6/0.8. EGFR 93. HbA1C N/A Chol 141, TG 75, HDL 49, LDL 77 TSH 1.0 normal    Review of Systems  Cardiovascular: Positive for dyspnea on exertion. Negative for chest pain, leg swelling, palpitations and syncope.         Vitals:   06/14/20 1334  BP: 108/68  Pulse: 75  Resp: 16  SpO2: 98%     Body mass index is 26.26 kg/m. Filed Weights   06/14/20 1334  Weight: 183 lb (83 kg)     Objective:   Physical Exam Vitals and nursing note reviewed.  Constitutional:      General: He is not in acute distress. Neck:     Vascular: No JVD.  Cardiovascular:     Rate and Rhythm: Normal rate and regular rhythm.     Heart sounds: Normal heart sounds. No murmur heard.   Pulmonary:     Effort: Pulmonary effort is normal.     Breath sounds: Normal breath sounds. No wheezing or rales.         Assessment & Recommendations:   64 y.o. Caucasian male with prior h/o tobacco and alcohol dependence, coronary atherosclerosis (CT Chest 03/2020), symptomatic paroxysmal atrial fibrillation.  Paroxysmal Afib: Currently in sinus rhythm CHA2DS2VASc score 1at the most-considering coronary atherosclerosis, annual stroke risk 0.6%. Recommend Aspirin 81 mg daily for now for coronary atherosclerosis. No ischemia on stress test.  1% Afib burden We discussed rhythm control versus rate control strategy.  Given relatively infrequent symptoms associated with A. fib, he wants to hold  off any rhythm control strategy at this time.  Coronary atherosclerosis: No angina symptoms at this time. No ischemia on stress testing. Continue Aspirin, staitn  Follow-up in 6 months   Nigel Mormon, MD Pager: [phone] Office: [phone]

## 2020-08-20 ENCOUNTER — Other Ambulatory Visit: Payer: Self-pay | Admitting: Internal Medicine

## 2020-08-20 DIAGNOSIS — R918 Other nonspecific abnormal finding of lung field: Secondary | ICD-10-CM

## 2020-09-05 ENCOUNTER — Ambulatory Visit: Payer: BC Managed Care – PPO

## 2020-09-24 ENCOUNTER — Ambulatory Visit
Admission: RE | Admit: 2020-09-24 | Discharge: 2020-09-24 | Disposition: A | Discharge status: Home / Self Care | Payer: BC Managed Care – PPO | Source: Ambulatory Visit | Attending: Internal Medicine | Admitting: Internal Medicine

## 2020-09-24 DIAGNOSIS — R918 Other nonspecific abnormal finding of lung field: Secondary | ICD-10-CM

## 2020-12-13 ENCOUNTER — Ambulatory Visit: Payer: BC Managed Care – PPO | Admitting: Cardiology

## 2020-12-20 ENCOUNTER — Ambulatory Visit: Payer: BC Managed Care – PPO | Admitting: Cardiology

## 2020-12-30 ENCOUNTER — Encounter: Payer: Self-pay | Admitting: Cardiology

## 2020-12-30 ENCOUNTER — Other Ambulatory Visit: Payer: Self-pay

## 2020-12-30 ENCOUNTER — Ambulatory Visit: Payer: BC Managed Care – PPO | Admitting: Cardiology

## 2020-12-30 VITALS — BP 115/73 | HR 55 | Temp 98.5°F | Ht 70.0 in | Wt 185.0 lb

## 2020-12-30 DIAGNOSIS — I2510 Atherosclerotic heart disease of native coronary artery without angina pectoris: Secondary | ICD-10-CM

## 2020-12-30 DIAGNOSIS — I480 Paroxysmal atrial fibrillation: Secondary | ICD-10-CM

## 2020-12-30 NOTE — Progress Notes (Signed)
Patient referred by [name], MD for atrial fibtillation  Subjective:   Craig Potts, male    DOB: [birth year], 65 y.o.   MRN: [id number]   Chief Complaint  Patient presents with  . Atrial Fibrillation  . Follow-up     HPI  65 y.o. Caucasian male with prior h/o tobacco and alcohol dependence, coronary atherosclerosis (CT Chest 03/2020), symptomatic paroxysmal atrial fibrillation.  Patient is doing well, and has only occasional shortness of breath-like he had while walking up and down the hill for 20 min at a car race recently. Denies any chest pain.   Initial consultation HPI 05/2020: Patient is a retired Administrator.  He has a long history of tobacco and alcohol dependence, now has been sober-quit smoking in 2014 and alcohol in September 2021.  At baseline, he does not have any chest pain, shortness of breath symptoms with regular walking activity.  She weeks ago, he got winded while playing ping-pong.  This led to a PCP visit, the EKG showed A. fib.  He also underwent CT chest for lung cancer screening, which showed coronary atherosclerosis.  Currently, patient does not have any symptoms.    Current Outpatient Medications on File Prior to Visit  Medication Sig Dispense Refill  . doxycycline (PERIOSTAT) 20 MG tablet Take 1 tablet by mouth 2 (two) times daily.    . fluticasone (FLONASE) 50 MCG/ACT nasal spray Place 2 sprays into the nose daily. 16 g 6  . mometasone (ASMANEX 60 METERED DOSES) 220 MCG/INH inhaler Inhale 2 puffs into the lungs daily. 3 Inhaler 2  . PROAIR HFA 108 (90 BASE) MCG/ACT inhaler INHALE 2 PUFFS INTO THE LUNGS 4 (FOUR) TIMES DAILY. 3 Inhaler 0  . QC LO-DOSE ASPIRIN 81 MG EC tablet daily.    . rosuvastatin (CRESTOR) 5 MG tablet Take 5 mg by mouth at bedtime.    . traZODone (DESYREL) 100 MG tablet Take 100-150 mg by mouth at bedtime as needed.    . triamcinolone cream (KENALOG) 0.1 % Apply 1 application topically 2 (two) times daily.     No current  facility-administered medications on file prior to visit.    Cardiovascular and other pertinent studies:  EKG 12/30/2020: Sinus rhythm 53 bpm RSR(V1) -nondiagnostic  Exercise Sestamibi Stress Test 06/03/2020: Normal ECG stress. The patient exercised for 4 minutes and 59 seconds of a Bruce protocol, achieving approximately 7 METs.  Exercise capacity is reduced.  Myocardial perfusion is normal. Mildly enlarged left ventricle in both rest and stress.  TID is normal. Stress LV EF is normal 67%.  No previous exam available for comparison. Low risk.   Echocardiogram 05/17/2020:  1. Left ventricle cavity is normal in size and wall thickness. Normal  global wall motion. Normal LV systolic function with EF 59%. Normal  diastolic filling pattern.  2. Moderate (Grade II) mitral regurgitation.  3. Mild tricuspid regurgitation.  4. No evidence of pulmonary hypertension  Mobile cardiac telemetry 13 days 05/16/2020 - 05/30/2020: Dominant rhythm: Sinus HR 197-69 bpm. Avg HR 42 bpm. 1.1% isolated SVE, <1% couplet/triplets. 1 episode of NSVT at 197 bpm for 5 beats <1% isolated VE, couplet/triplets. Atrial fibrillation/atrial flutter occurred for 1%, HR 66-174 bpm. Longest episode lasted for 56 min 12 sec, avg HR 111 bpm. 4 patient triggered events correlated with Afib/flutter was detected within 45 seconds of patient symptoms No SVT/high grade AV block, sinus pause >3sec noted.  EKG 05/16/2020: Sinus rhythm 98 bpm  RSR(V1) -nondiagnostic  EKG 05/13/2020: Atrial  fibrillation 100 bpm  EKG 01/18/2020: Sinus rhythm 60 bpm.  Frequent PACs.  CT chest lung cancer screening 02/05/2020: 1. Lung-RADS 3S, probably benign findings. Short-term follow-up in 6 months is recommended with repeat low-dose chest CT without contrast (please use the following order, "CT CHEST LCS NODULE FOLLOW-UP W/O CM"). 2. The "S" modifier above refers to potentially clinically significant non lung cancer related findings.  Specifically, there is aortic atherosclerosis, in addition to left anterior descending coronary artery disease. Please note that although the presence of coronary artery calcium documents the presence of coronary artery disease, the severity of this disease and any potential stenosis cannot be assessed on this non-gated CT examination. Assessment for potential risk factor modification, dietary therapy or pharmacologic therapy may be warranted, if clinically indicated. 3. Mild diffuse bronchial wall thickening with very mild centrilobular and paraseptal emphysema; imaging findings suggestive of underlying COPD.   Recent labs: 03/27/2020: Glucose 79, BUN/Cr 6/0.8. EGFR 93. HbA1C N/A Chol 141, TG 75, HDL 49, LDL 77 TSH 1.0 normal    Review of Systems  Cardiovascular: Positive for dyspnea on exertion. Negative for chest pain, leg swelling, palpitations and syncope.         Vitals:   12/30/20 1048  BP: 115/73  Pulse: (!) 55  Temp: 98.5 F (36.9 C)  SpO2: 98%     Body mass index is 26.54 kg/m. Filed Weights   12/30/20 1048  Weight: 185 lb (83.9 kg)     Objective:   Physical Exam Vitals and nursing note reviewed.  Constitutional:      General: He is not in acute distress. Neck:     Vascular: No JVD.  Cardiovascular:     Rate and Rhythm: Normal rate and regular rhythm.     Heart sounds: Normal heart sounds. No murmur heard.   Pulmonary:     Effort: Pulmonary effort is normal.     Breath sounds: Normal breath sounds. No wheezing or rales.         Assessment & Recommendations:   65 y.o. Caucasian male with prior h/o tobacco and alcohol dependence, coronary atherosclerosis (CT Chest 03/2020), symptomatic paroxysmal atrial fibrillation.  Paroxysmal Afib: Currently in sinus rhythm CHA2DS2VASc score 1at the most-considering coronary atherosclerosis, annual stroke risk 0.6%. Recommend Aspirin 81 mg daily for now for coronary atherosclerosis. No ischemia on  stress test.  1% Afib burden Given relatively infrequent symptoms associated with A. fib, he wants to hold off any rhythm control strategy at this time.  Coronary atherosclerosis: No angina symptoms at this time. No ischemia on stress testing. Occasional dyspnea likely due to h/o smoking and possible COPD. Continue Aspirin, staitn  F/u in 6 months   Nigel Mormon, MD Pager: [phone] Office: [phone]

## 2021-01-20 DIAGNOSIS — R5383 Other fatigue: Secondary | ICD-10-CM | POA: Diagnosis not present

## 2021-01-20 DIAGNOSIS — Z0000 Encounter for general adult medical examination without abnormal findings: Secondary | ICD-10-CM | POA: Diagnosis not present

## 2021-01-20 DIAGNOSIS — R799 Abnormal finding of blood chemistry, unspecified: Secondary | ICD-10-CM | POA: Diagnosis not present

## 2021-01-20 DIAGNOSIS — E7800 Pure hypercholesterolemia, unspecified: Secondary | ICD-10-CM | POA: Diagnosis not present

## 2021-01-27 DIAGNOSIS — Z024 Encounter for examination for driving license: Secondary | ICD-10-CM | POA: Diagnosis not present

## 2021-01-27 DIAGNOSIS — Z0000 Encounter for general adult medical examination without abnormal findings: Secondary | ICD-10-CM | POA: Diagnosis not present

## 2021-01-27 DIAGNOSIS — E7800 Pure hypercholesterolemia, unspecified: Secondary | ICD-10-CM | POA: Diagnosis not present

## 2021-01-27 DIAGNOSIS — J45909 Unspecified asthma, uncomplicated: Secondary | ICD-10-CM | POA: Diagnosis not present

## 2021-05-27 DIAGNOSIS — Z23 Encounter for immunization: Secondary | ICD-10-CM | POA: Diagnosis not present

## 2021-06-24 DIAGNOSIS — R350 Frequency of micturition: Secondary | ICD-10-CM | POA: Diagnosis not present

## 2021-06-30 ENCOUNTER — Ambulatory Visit: Payer: Self-pay | Admitting: Cardiology

## 2021-06-30 ENCOUNTER — Encounter: Payer: Self-pay | Admitting: Cardiology

## 2021-06-30 ENCOUNTER — Other Ambulatory Visit: Payer: Self-pay

## 2021-06-30 VITALS — BP 100/61 | HR 60 | Temp 98.0°F | Resp 16 | Ht 70.0 in | Wt 168.0 lb

## 2021-06-30 DIAGNOSIS — I480 Paroxysmal atrial fibrillation: Secondary | ICD-10-CM | POA: Diagnosis not present

## 2021-06-30 DIAGNOSIS — I2510 Atherosclerotic heart disease of native coronary artery without angina pectoris: Secondary | ICD-10-CM | POA: Diagnosis not present

## 2021-06-30 MED ORDER — ROSUVASTATIN CALCIUM 10 MG PO TABS
10.0000 mg | ORAL_TABLET | Freq: Every day | ORAL | 3 refills | Status: DC
Start: 2021-06-30 — End: 2022-06-29

## 2021-06-30 NOTE — Progress Notes (Signed)
Patient referred by [name], MD for atrial fibtillation  Subjective:   Craig Potts, male    DOB: 09-12-1955, 65 y.o.   MRN: [id number]   Chief Complaint  Patient presents with   Atrial Fibrillation   Follow-up    6 month   Coronary Artery Disease     HPI  65 y.o. Caucasian male with prior h/o tobacco and alcohol dependence, coronary atherosclerosis (CT Chest 03/2020), symptomatic paroxysmal atrial fibrillation.  Patient is doing well, denies chest pain, shortness of breath, palpitations, leg edema, orthopnea, PND, TIA/syncope.  Initial consultation HPI 05/2020: Patient is a retired Administrator.  He has a long history of tobacco and alcohol dependence, now has been sober-quit smoking in 2014 and alcohol in September 2021.  At baseline, he does not have any chest pain, shortness of breath symptoms with regular walking activity.  She weeks ago, he got winded while playing ping-pong.  This led to a PCP visit, the EKG showed A. fib.  He also underwent CT chest for lung cancer screening, which showed coronary atherosclerosis.  Currently, patient does not have any symptoms.    Current Outpatient Medications on File Prior to Visit  Medication Sig Dispense Refill   doxycycline (PERIOSTAT) 20 MG tablet Take 1 tablet by mouth 2 (two) times daily.     fluticasone (FLONASE) 50 MCG/ACT nasal spray Place 2 sprays into the nose daily. 16 g 6   PROAIR HFA 108 (90 BASE) MCG/ACT inhaler INHALE 2 PUFFS INTO THE LUNGS 4 (FOUR) TIMES DAILY. 3 Inhaler 0   QC LO-DOSE ASPIRIN 81 MG EC tablet daily.     rosuvastatin (CRESTOR) 5 MG tablet Take 5 mg by mouth at bedtime.     traZODone (DESYREL) 100 MG tablet Take 100-150 mg by mouth at bedtime as needed.     triamcinolone cream (KENALOG) 0.1 % Apply 1 application topically 2 (two) times daily.     No current facility-administered medications on file prior to visit.    Cardiovascular and other pertinent studies:  EKG 06/30/2021: Sinus rhythm  66 bpm Normal EKG  Exercise Sestamibi Stress Test 06/03/2020: Normal ECG stress. The patient exercised for 4 minutes and 59 seconds of a Bruce protocol, achieving approximately 7 METs.  Exercise capacity is reduced.  Myocardial perfusion is normal. Mildly enlarged left ventricle in both rest and stress.  TID is normal. Stress LV EF is normal 67%.  No previous exam available for comparison. Low risk.   Echocardiogram 05/17/2020:  1. Left ventricle cavity is normal in size and wall thickness. Normal  global wall motion. Normal LV systolic function with EF 59%. Normal  diastolic filling pattern.  2. Moderate (Grade II) mitral regurgitation.  3. Mild tricuspid regurgitation.  4. No evidence of pulmonary hypertension  Mobile cardiac telemetry 13 days 05/16/2020 - 05/30/2020: Dominant rhythm: Sinus HR 197-69 bpm. Avg HR 42 bpm. 1.1% isolated SVE, <1% couplet/triplets. 1 episode of NSVT at 197 bpm for 5 beats <1% isolated VE, couplet/triplets. Atrial fibrillation/atrial flutter occurred for 1%, HR 66-174 bpm. Longest episode lasted for 56 min 12 sec, avg HR 111 bpm. 4 patient triggered events correlated with Afib/flutter was detected within 45 seconds of patient symptoms No SVT/high grade AV block, sinus pause >3sec noted.  EKG 05/16/2020: Sinus rhythm 98 bpm  RSR(V1) -nondiagnostic  EKG 05/13/2020: Atrial fibrillation 100 bpm  EKG 01/18/2020: Sinus rhythm 60 bpm.  Frequent PACs.  CT chest lung cancer screening 02/05/2020: 1. Lung-RADS 3S, probably benign findings. Short-term  follow-up in 6 months is recommended with repeat low-dose chest CT without contrast (please use the following order, "CT CHEST LCS NODULE FOLLOW-UP W/O CM"). 2. The "S" modifier above refers to potentially clinically significant non lung cancer related findings. Specifically, there is aortic atherosclerosis, in addition to left anterior descending coronary artery disease. Please note that although the presence  of coronary artery calcium documents the presence of coronary artery disease, the severity of this disease and any potential stenosis cannot be assessed on this non-gated CT examination. Assessment for potential risk factor modification, dietary therapy or pharmacologic therapy may be warranted, if clinically indicated. 3. Mild diffuse bronchial wall thickening with very mild centrilobular and paraseptal emphysema; imaging findings suggestive of underlying COPD.    Recent labs: 01/20/2021: Chol 185, TG 104, HDL 38, LDL 77  03/27/2020: Glucose 79, BUN/Cr 6/0.8. EGFR 93. HbA1C N/A Chol 141, TG 75, HDL 49, LDL 77 TSH 1.0 normal    Review of Systems  Cardiovascular:  Positive for dyspnea on exertion. Negative for chest pain, leg swelling, palpitations and syncope.        Vitals:   06/30/21 1317  BP: 100/61  Pulse: 60  Resp: 16  Temp: 98 F (36.7 C)  SpO2: 98%     Body mass index is 24.11 kg/m. Filed Weights   06/30/21 1317  Weight: 168 lb (76.2 kg)     Objective:   Physical Exam Vitals and nursing note reviewed.  Constitutional:      General: He is not in acute distress. Neck:     Vascular: No JVD.  Cardiovascular:     Rate and Rhythm: Normal rate and regular rhythm.     Heart sounds: Normal heart sounds. No murmur heard. Pulmonary:     Effort: Pulmonary effort is normal.     Breath sounds: Normal breath sounds. No wheezing or rales.        Assessment & Recommendations:   65 y.o. Caucasian male with prior h/o tobacco and alcohol dependence, coronary atherosclerosis (CT Chest 03/2020), symptomatic paroxysmal atrial fibrillation.  Paroxysmal Afib: Currently in sinus rhythm CHA2DS2VASc score 1at the most-considering coronary atherosclerosis, annual stroke risk 0.6%. Recommend Aspirin 81 mg daily for now for coronary atherosclerosis. No ischemia on stress test.  1% Afib burden Given relatively infrequent symptoms associated with A. fib, he wants to hold  off any rhythm control strategy at this time.  Coronary atherosclerosis: No angina symptoms at this time. No ischemia on stress testing. Occasional dyspnea likely due to h/o smoking and possible COPD. Continue Aspirin, staitn Increased Crestor to 10 mg, goal LDL <70  F/u in 1 year   Nigel Mormon, MD Pager: [phone] Office: [phone]

## 2021-12-14 IMAGING — CT CT CHEST LUNG CANCER SCREENING LOW DOSE W/O CM
2 of 5 series · 15 of 40 positions shown, 18 images · non-contrast
Comparison: No priors.

CLINICAL DATA: 64-year-old male former smoker (quit 7 years ago)
with 60 pack-year history of smoking. Lung cancer screening
examination.

EXAM:
CT CHEST WITHOUT CONTRAST LOW-DOSE FOR LUNG CANCER SCREENING
TECHNIQUE: Multidetector CT imaging of the chest was performed following the
standard protocol without IV contrast.

[Series 4: lung 1.00 br44 cor · coronal · 0.63mm/px · 3 of 253 slices shown]
[im 51/253  lung]
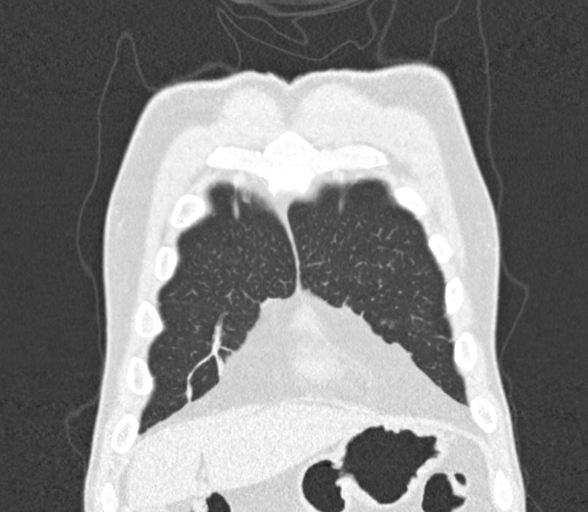
[im 101/253  lung]
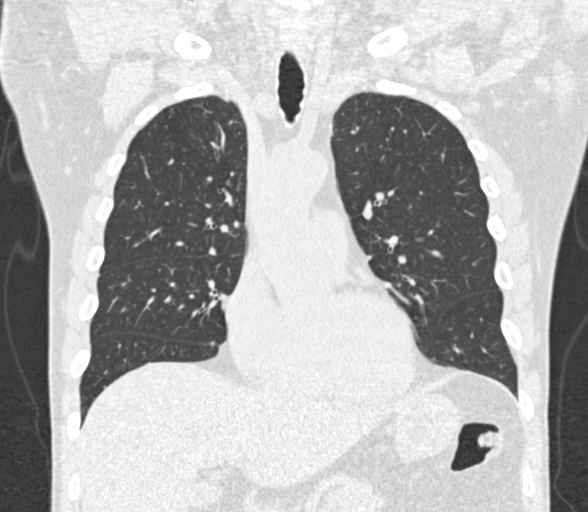
[im 152/253  lung]
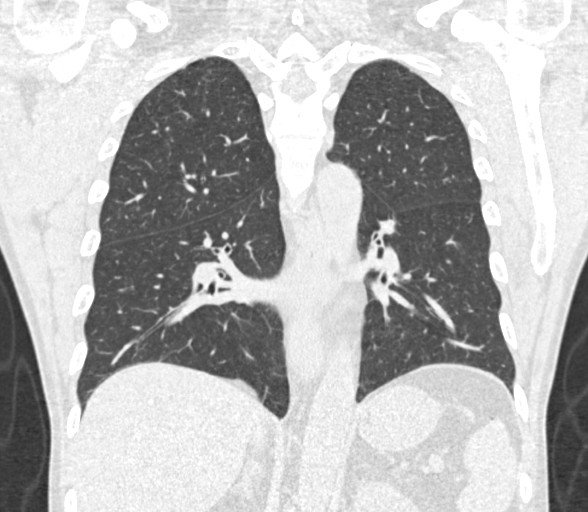

[Series 9: lung 1.00 br60 axial · axial · 0.72mm/px · z∈[-1142,-854]mm · 12 of 320 slices shown, 15 images]
[im 16/320  mediastinal]
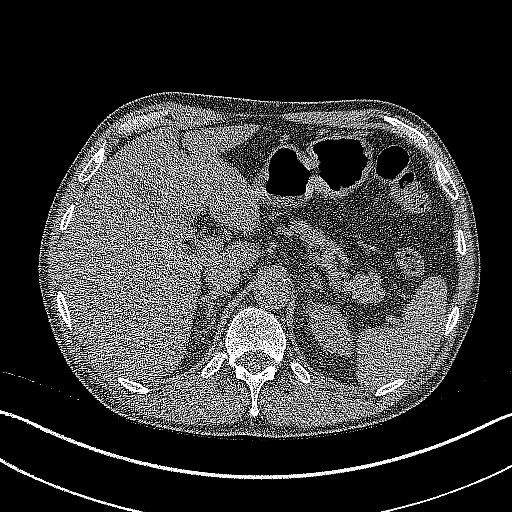
[im 16/320  lung]
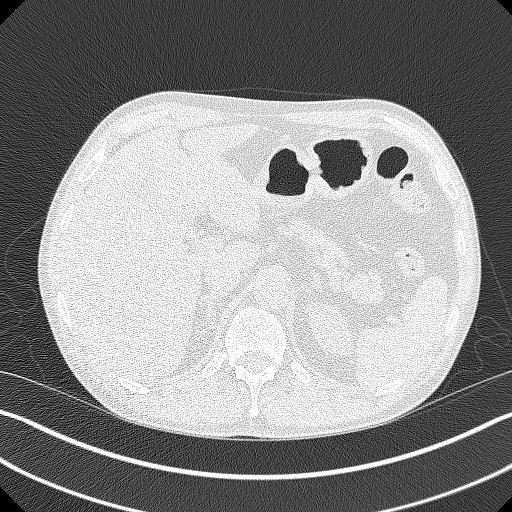
[im 46/320  lung]
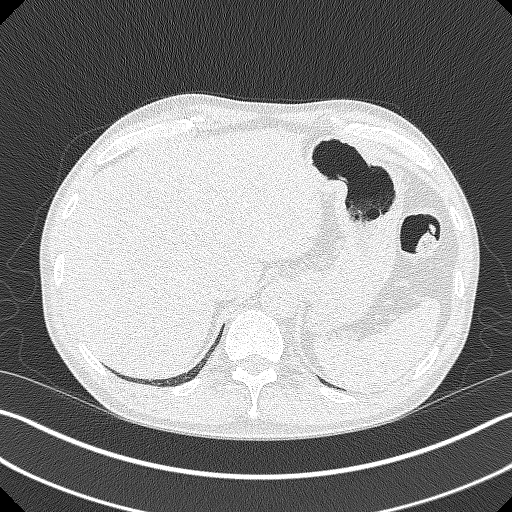
[im 76/320  lung]
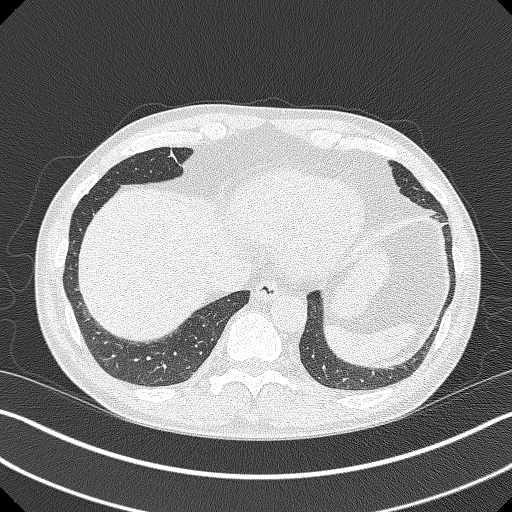
[im 92/320  lung]
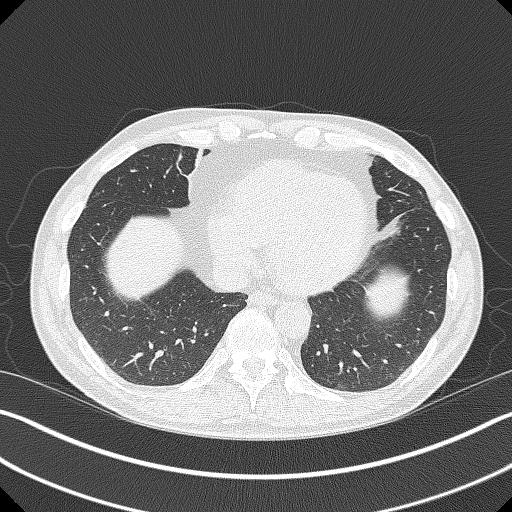
[im 122/320  mediastinal]
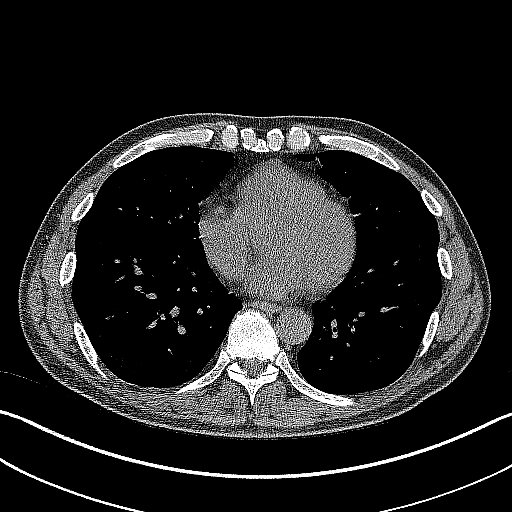
[im 122/320  lung]
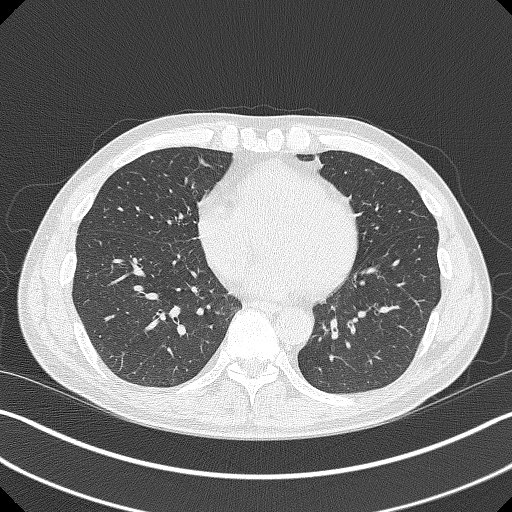
[im 152/320  lung]
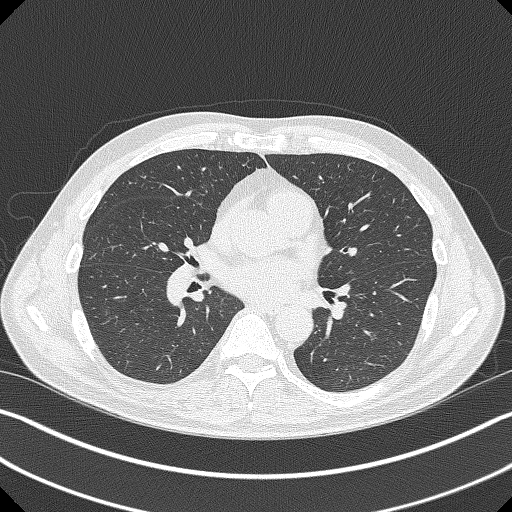
[im 168/320  lung]
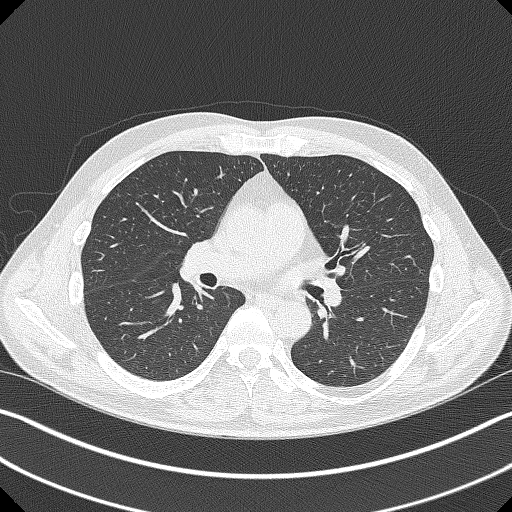
[im 198/320  lung]
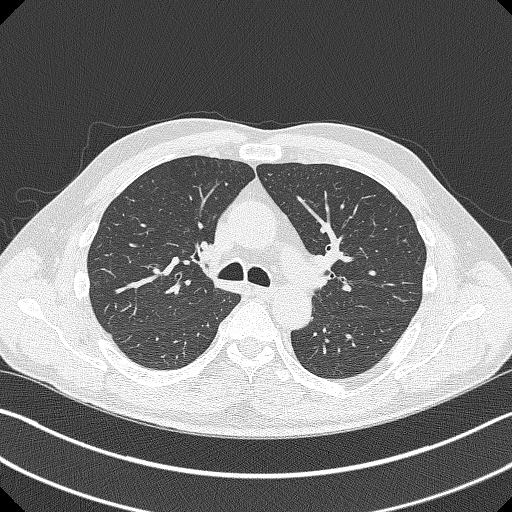
[im 228/320  mediastinal]
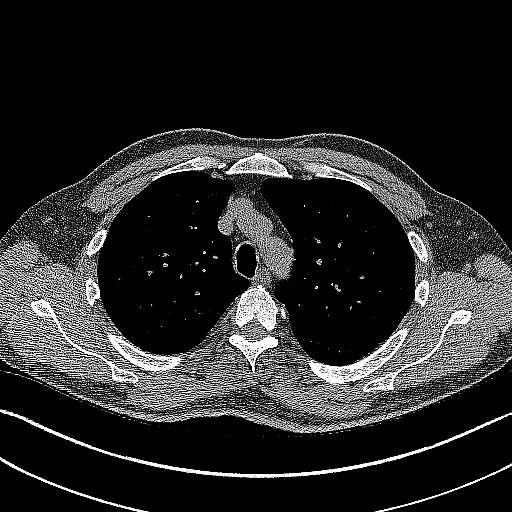
[im 228/320  lung]
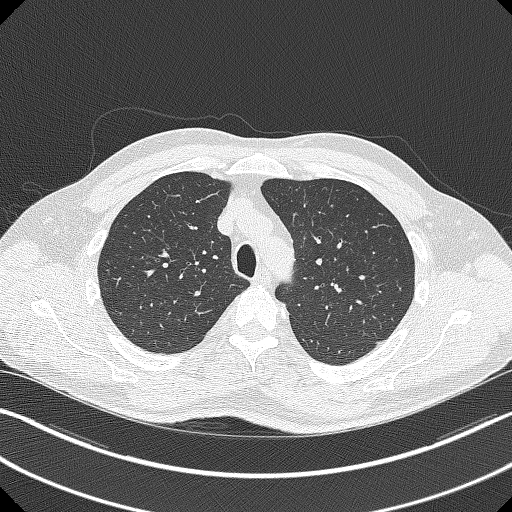
[im 244/320  lung]
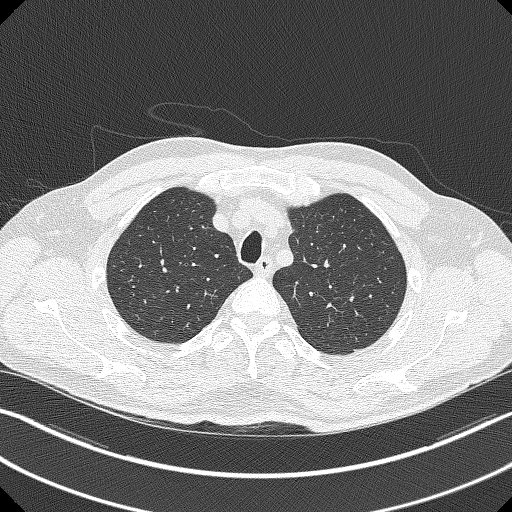
[im 274/320  lung]
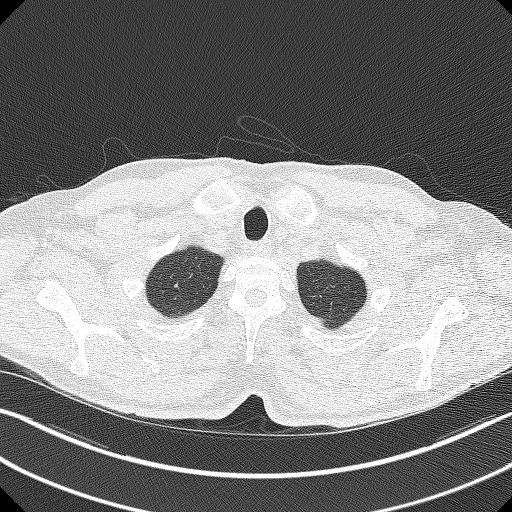
[im 304/320  lung]
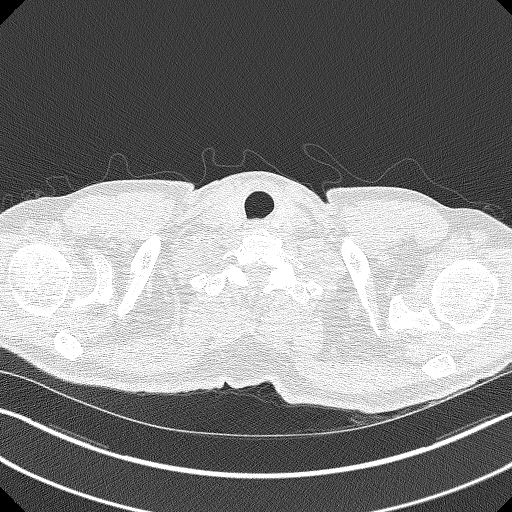

[15 of 40 positions shown; findings below may reference images not displayed]

FINDINGS: Cardiovascular: Heart size is normal. There is no significant
pericardial fluid, thickening or pericardial calcification. There is
aortic atherosclerosis, as well as atherosclerosis of the great
vessels of the mediastinum and the coronary arteries, including
calcified atherosclerotic plaque in the left anterior descending
coronary artery.

Mediastinum/Nodes: No pathologically enlarged mediastinal or hilar
lymph nodes. Please note that accurate exclusion of hilar adenopathy
is limited on noncontrast CT scans. Esophagus is unremarkable in
appearance. No axillary lymphadenopathy.

Lungs/Pleura: Several pulmonary nodules are noted throughout the
lungs bilaterally, largest of which is in the inferior segment of
the lingula (axial image 222 of series 3), with a volume derived
mean diameter of 7.4 mm. No other larger more suspicious appearing
pulmonary nodules or masses are noted. No acute consolidative
airspace disease. No pleural effusions. Diffuse bronchial wall
thickening with a very mild centrilobular and paraseptal emphysema.

Upper Abdomen: Unremarkable.

Musculoskeletal: There are no aggressive appearing lytic or blastic
lesions noted in the visualized portions of the skeleton.
IMPRESSION: 1. Lung-RADS 3S, probably benign findings. Short-term follow-up in 6
months is recommended with repeat low-dose chest CT without contrast
(please use the following order, "CT CHEST LCS NODULE FOLLOW-UP W/O
CM").
2. The "S" modifier above refers to potentially clinically
significant non lung cancer related findings. Specifically, there is
aortic atherosclerosis, in addition to left anterior descending
coronary artery disease. Please note that although the presence of
coronary artery calcium documents the presence of coronary artery
disease, the severity of this disease and any potential stenosis
cannot be assessed on this non-gated CT examination. Assessment for
potential risk factor modification, dietary therapy or pharmacologic
therapy may be warranted, if clinically indicated.
3. Mild diffuse bronchial wall thickening with very mild
centrilobular and paraseptal emphysema; imaging findings suggestive
of underlying COPD.

Aortic Atherosclerosis (8VD69-1N2.2) and Emphysema (8VD69-NND.P).

## 2022-02-06 DIAGNOSIS — I2510 Atherosclerotic heart disease of native coronary artery without angina pectoris: Secondary | ICD-10-CM | POA: Diagnosis not present

## 2022-02-06 DIAGNOSIS — E7800 Pure hypercholesterolemia, unspecified: Secondary | ICD-10-CM | POA: Diagnosis not present

## 2022-02-17 DIAGNOSIS — I2510 Atherosclerotic heart disease of native coronary artery without angina pectoris: Secondary | ICD-10-CM | POA: Diagnosis not present

## 2022-02-17 DIAGNOSIS — D696 Thrombocytopenia, unspecified: Secondary | ICD-10-CM | POA: Diagnosis not present

## 2022-02-17 DIAGNOSIS — R911 Solitary pulmonary nodule: Secondary | ICD-10-CM | POA: Diagnosis not present

## 2022-02-17 DIAGNOSIS — J439 Emphysema, unspecified: Secondary | ICD-10-CM | POA: Diagnosis not present

## 2022-02-17 DIAGNOSIS — I480 Paroxysmal atrial fibrillation: Secondary | ICD-10-CM | POA: Diagnosis not present

## 2022-02-17 DIAGNOSIS — Z23 Encounter for immunization: Secondary | ICD-10-CM | POA: Diagnosis not present

## 2022-02-17 DIAGNOSIS — Z0000 Encounter for general adult medical examination without abnormal findings: Secondary | ICD-10-CM | POA: Diagnosis not present

## 2022-02-18 ENCOUNTER — Other Ambulatory Visit: Payer: Self-pay | Admitting: Internal Medicine

## 2022-02-18 DIAGNOSIS — R911 Solitary pulmonary nodule: Secondary | ICD-10-CM

## 2022-03-24 ENCOUNTER — Ambulatory Visit
Admission: RE | Admit: 2022-03-24 | Discharge: 2022-03-24 | Disposition: A | Discharge status: Home / Self Care | Payer: Medicare Other | Source: Ambulatory Visit | Attending: Internal Medicine | Admitting: Internal Medicine

## 2022-03-24 ENCOUNTER — Other Ambulatory Visit: Payer: Self-pay | Admitting: Internal Medicine

## 2022-03-24 DIAGNOSIS — R911 Solitary pulmonary nodule: Secondary | ICD-10-CM

## 2022-03-24 DIAGNOSIS — I2510 Atherosclerotic heart disease of native coronary artery without angina pectoris: Secondary | ICD-10-CM | POA: Diagnosis not present

## 2022-03-24 DIAGNOSIS — Z87891 Personal history of nicotine dependence: Secondary | ICD-10-CM | POA: Diagnosis not present

## 2022-03-24 DIAGNOSIS — J432 Centrilobular emphysema: Secondary | ICD-10-CM | POA: Diagnosis not present

## 2022-03-24 DIAGNOSIS — I700 Atherosclerosis of aorta: Secondary | ICD-10-CM | POA: Diagnosis not present

## 2022-03-26 ENCOUNTER — Other Ambulatory Visit: Payer: Self-pay | Admitting: Internal Medicine

## 2022-04-06 ENCOUNTER — Other Ambulatory Visit: Payer: Self-pay | Admitting: Internal Medicine

## 2022-04-06 DIAGNOSIS — Z87891 Personal history of nicotine dependence: Secondary | ICD-10-CM

## 2022-06-22 DIAGNOSIS — H524 Presbyopia: Secondary | ICD-10-CM | POA: Diagnosis not present

## 2022-06-29 ENCOUNTER — Ambulatory Visit: Payer: Medicare Other | Admitting: Cardiology

## 2022-06-29 ENCOUNTER — Encounter: Payer: Self-pay | Admitting: Cardiology

## 2022-06-29 VITALS — BP 115/69 | HR 61 | Resp 16 | Ht 70.0 in | Wt 174.8 lb

## 2022-06-29 DIAGNOSIS — I2510 Atherosclerotic heart disease of native coronary artery without angina pectoris: Secondary | ICD-10-CM

## 2022-06-29 DIAGNOSIS — I480 Paroxysmal atrial fibrillation: Secondary | ICD-10-CM

## 2022-06-29 MED ORDER — ROSUVASTATIN CALCIUM 10 MG PO TABS: 10.0000 mg | ORAL_TABLET | Freq: Every day | ORAL | 3 refills | Status: DC

## 2022-06-29 NOTE — Progress Notes (Signed)
Patient referred by Craig Pretty, MD for atrial fibtillation  Subjective:   Craig Potts, male    DOB: [birth year], 66 y.o.   MRN: [id number]   Chief Complaint  Patient presents with   Atrial Fibrillation   Follow-up    1 year     HPI  66 y.o. Caucasian male with prior h/o tobacco and alcohol dependence, coronary atherosclerosis (CT Chest 03/2020), symptomatic paroxysmal atrial fibrillation.  Patient is doing well, denies chest pain, shortness of breath, palpitations, leg edema, orthopnea, PND, TIA/syncope. Reviewed recent test results with the patient, details below.    Initial consultation HPI 05/2020: Patient is a retired Administrator.  He has a long history of tobacco and alcohol dependence, now has been sober-quit smoking in 2014 and alcohol in September 2021.  At baseline, he does not have any chest pain, shortness of breath symptoms with regular walking activity.  She weeks ago, he got winded while playing ping-pong.  This led to a PCP visit, the EKG showed A. fib.  He also underwent CT chest for lung cancer screening, which showed coronary atherosclerosis.  Currently, patient does not have any symptoms.   Current Outpatient Medications:    fluticasone (FLONASE) 50 MCG/ACT nasal spray, Place 2 sprays into the nose daily., Disp: 16 g, Rfl: 6   PROAIR HFA 108 (90 BASE) MCG/ACT inhaler, INHALE 2 PUFFS INTO THE LUNGS 4 (FOUR) TIMES DAILY., Disp: 3 Inhaler, Rfl: 0   QC LO-DOSE ASPIRIN 81 MG EC tablet, daily., Disp: , Rfl:    rosuvastatin (CRESTOR) 10 MG tablet, Take 1 tablet (10 mg total) by mouth daily., Disp: 90 tablet, Rfl: 3   traZODone (DESYREL) 100 MG tablet, Take 100-150 mg by mouth at bedtime as needed., Disp: , Rfl:   Cardiovascular and other pertinent studies:  EKG 06/30/2021: Sinus rhythm 66 bpm Normal EKG  Exercise Sestamibi Stress Test 06/03/2020: Normal ECG stress. The patient exercised for 4 minutes and 59 seconds of a Bruce protocol, achieving  approximately 7 METs.  Exercise capacity is reduced.  Myocardial perfusion is normal. Mildly enlarged left ventricle in both rest and stress.  TID is normal. Stress LV EF is normal 67%.  No previous exam available for comparison. Low risk.   Echocardiogram 05/17/2020:  1. Left ventricle cavity is normal in size and wall thickness. Normal  global wall motion. Normal LV systolic function with EF 59%. Normal  diastolic filling pattern.  2. Moderate (Grade II) mitral regurgitation.  3. Mild tricuspid regurgitation.  4. No evidence of pulmonary hypertension  Mobile cardiac telemetry 13 days 05/16/2020 - 05/30/2020: Dominant rhythm: Sinus HR 197-69 bpm. Avg HR 42 bpm. 1.1% isolated SVE, <1% couplet/triplets. 1 episode of NSVT at 197 bpm for 5 beats <1% isolated VE, couplet/triplets. Atrial fibrillation/atrial flutter occurred for 1%, HR 66-174 bpm. Longest episode lasted for 56 min 12 sec, avg HR 111 bpm. 4 patient triggered events correlated with Afib/flutter was detected within 45 seconds of patient symptoms No SVT/high grade AV block, sinus pause >3sec noted.  EKG 05/16/2020: Sinus rhythm 98 bpm  RSR(V1) -nondiagnostic  EKG 05/13/2020: Atrial fibrillation 100 bpm  EKG 01/18/2020: Sinus rhythm 60 bpm.  Frequent PACs.  CT chest lung cancer screening 02/05/2020: 1. Lung-RADS 3S, probably benign findings. Short-term follow-up in 6 months is recommended with repeat low-dose chest CT without contrast (please use the following order, "CT CHEST LCS NODULE FOLLOW-UP W/O CM"). 2. The "S" modifier above refers to potentially clinically significant non lung cancer related  findings. Specifically, there is aortic atherosclerosis, in addition to left anterior descending coronary artery disease. Please note that although the presence of coronary artery calcium documents the presence of coronary artery disease, the severity of this disease and any potential stenosis cannot be assessed on this  non-gated CT examination. Assessment for potential risk factor modification, dietary therapy or pharmacologic therapy may be warranted, if clinically indicated. 3. Mild diffuse bronchial wall thickening with very mild centrilobular and paraseptal emphysema; imaging findings suggestive of underlying COPD.    Recent labs: 01/20/2021: Chol 185, TG 104, HDL 38, LDL 77  03/27/2020: Glucose 79, BUN/Cr 6/0.8. EGFR 93. HbA1C N/A Chol 141, TG 75, HDL 49, LDL 77 TSH 1.0 normal    Review of Systems  Cardiovascular:  Negative for chest pain, dyspnea on exertion, leg swelling, palpitations and syncope.         Vitals:   06/29/22 1407  BP: 115/69  Pulse: 61  Resp: 16  SpO2: 96%     Body mass index is 25.08 kg/m. Filed Weights   06/29/22 1407  Weight: 174 lb 12.8 oz (79.3 kg)     Objective:   Physical Exam Vitals and nursing note reviewed.  Constitutional:      General: He is not in acute distress. Neck:     Vascular: No JVD.  Cardiovascular:     Rate and Rhythm: Normal rate and regular rhythm.     Heart sounds: Normal heart sounds. No murmur heard. Pulmonary:     Effort: Pulmonary effort is normal.     Breath sounds: Normal breath sounds. No wheezing or rales.  Musculoskeletal:     Right lower leg: No edema.     Left lower leg: No edema.         Assessment & Recommendations:   66 y.o. Caucasian male with prior h/o tobacco and alcohol dependence, coronary atherosclerosis (CT Chest 03/2020), symptomatic paroxysmal atrial fibrillation.  Paroxysmal Afib: Currently in sinus rhythm CHA2DS2VASc score 1at the most-considering coronary atherosclerosis, annual stroke risk 0.6%. Recommend Aspirin 81 mg daily for now for coronary atherosclerosis. No ischemia on stress test.  1% Afib burden (05/2020). Given relatively infrequent symptoms associated with A. fib, he wants to hold off any rhythm control strategy at this time.  Coronary atherosclerosis: No angina symptoms  at this time. No ischemia on stress testing. Continue Aspirin (tolerating well without bleeding), statin.  Refilled Crestor 10 mg daily.  F/u in 1 year   Craig Mormon, MD Pager: [phone] Office: [phone]

## 2022-07-06 DIAGNOSIS — R350 Frequency of micturition: Secondary | ICD-10-CM | POA: Diagnosis not present

## 2022-08-03 IMAGING — CT CT CHEST LCS NODULE FOLLOW-UP W/O CM
2 of 5 series · 15 of 40 positions shown, 18 images · non-contrast
Comparison: 02/05/2020

CLINICAL DATA: Sixty pack-year smoking history, quitting 7 years
ago. Short-term follow-up.

EXAM:
CT CHEST WITHOUT CONTRAST FOR LUNG CANCER SCREENING NODULE FOLLOW-UP
TECHNIQUE: Multidetector CT imaging of the chest was performed following the
standard protocol without IV contrast.

[Series 6: lcs f/u 1.00 br60 s3 axial lung · axial · 0.78mm/px · z∈[-1169,-888]mm · 12 of 311 slices shown, 15 images]
[im 15/311  mediastinal]
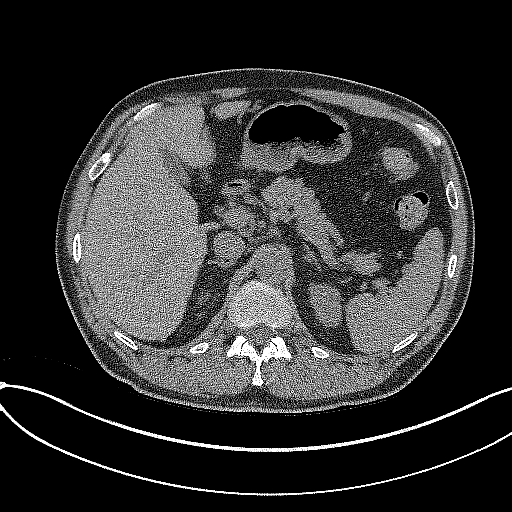
[im 15/311  lung]
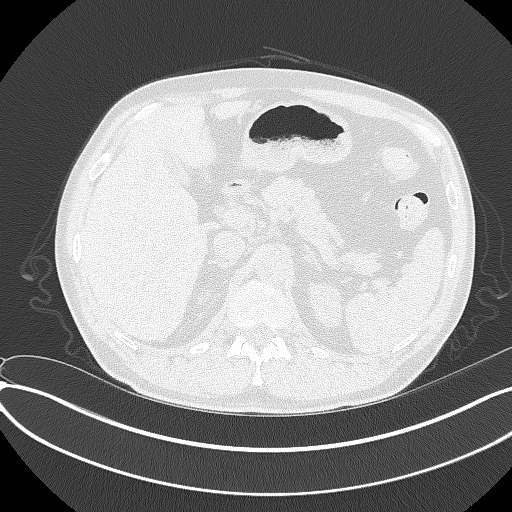
[im 45/311  lung]
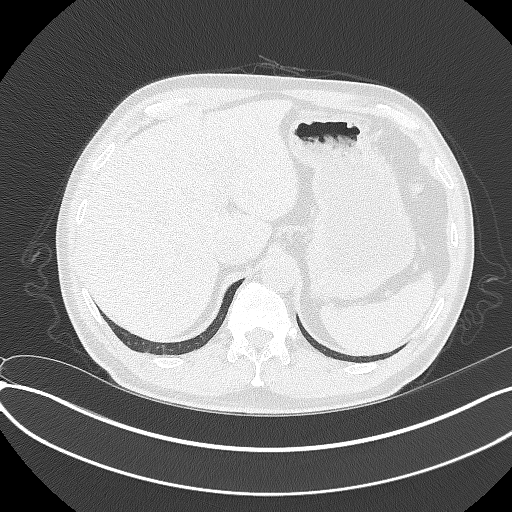
[im 74/311  lung]
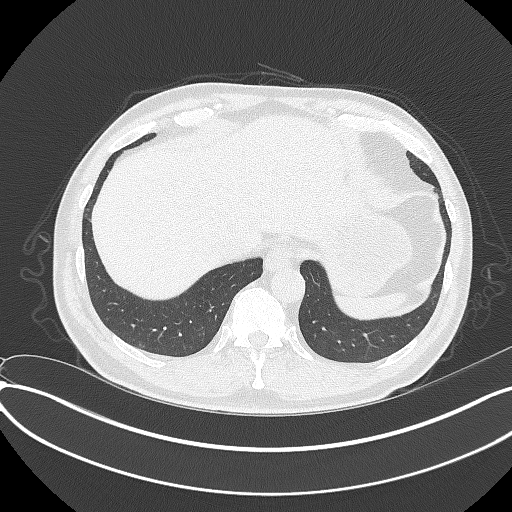
[im 89/311  lung]
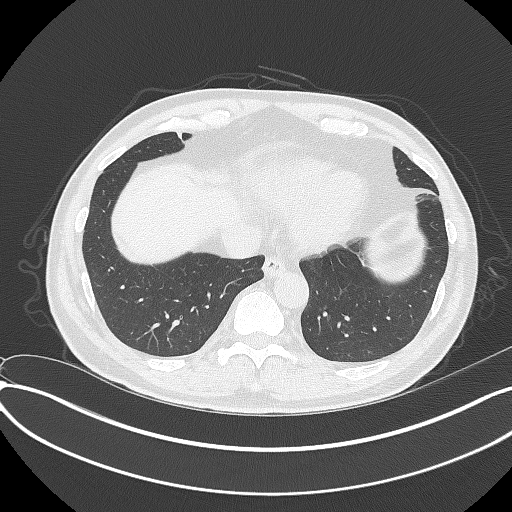
[im 119/311  mediastinal]
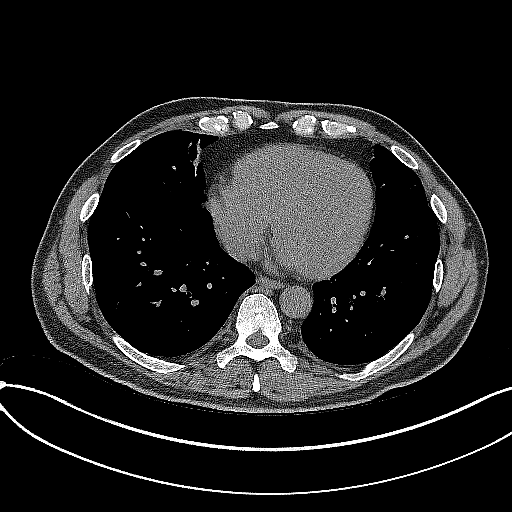
[im 119/311  lung]
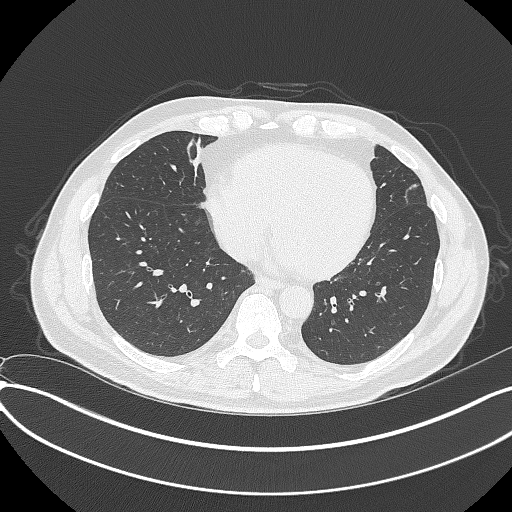
[im 148/311  lung]
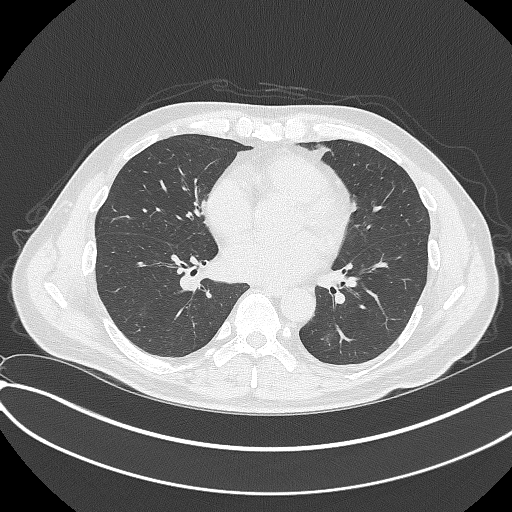
[im 163/311  lung]
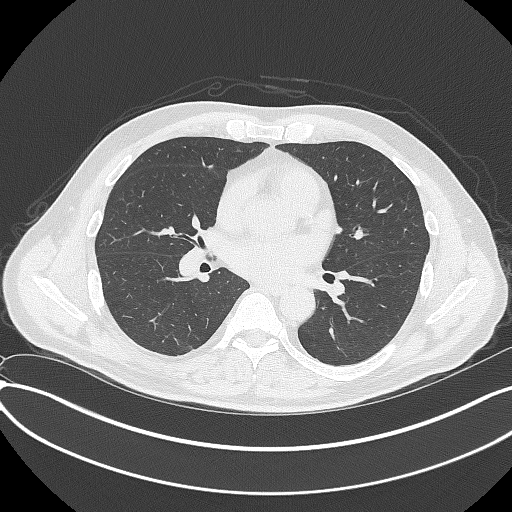
[im 192/311  lung]
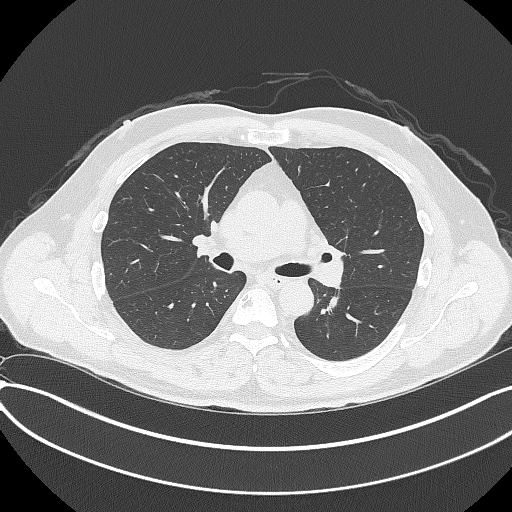
[im 222/311  mediastinal]
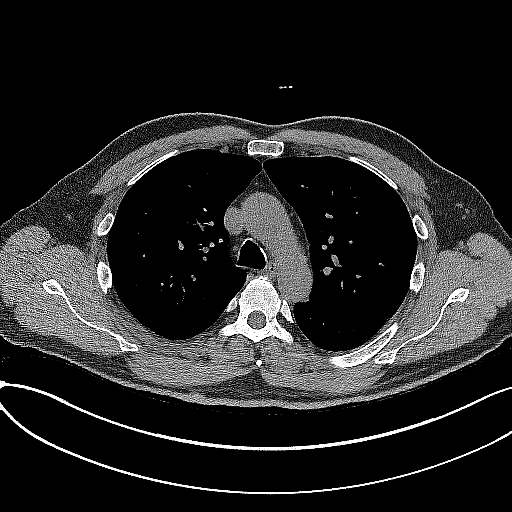
[im 222/311  lung]
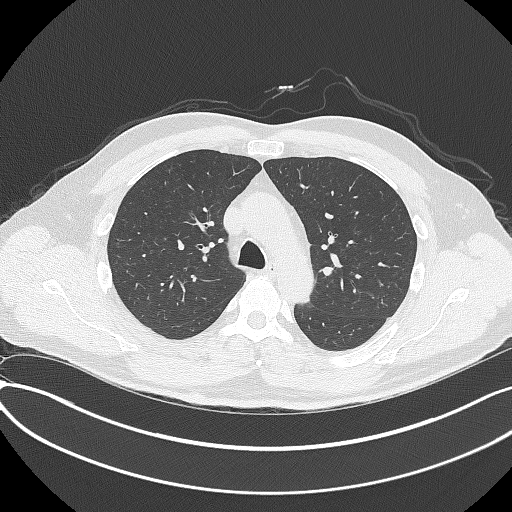
[im 237/311  lung]
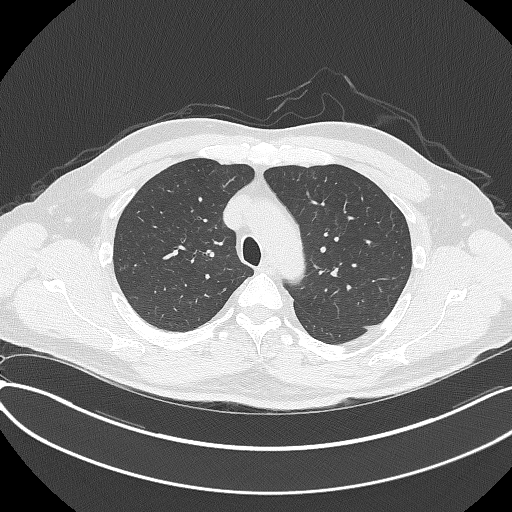
[im 266/311  lung]
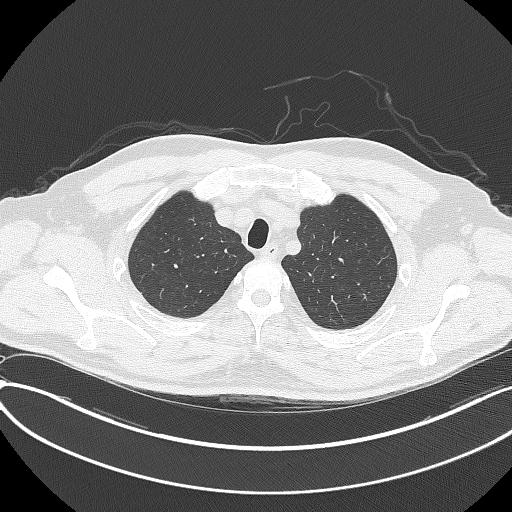
[im 296/311  lung]
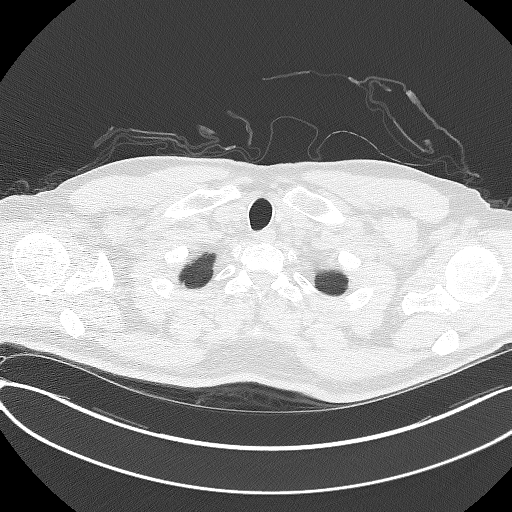

[Series 7: lcs f/u 1.00 br44 s3 cor · coronal · 0.61mm/px · 3 of 353 slices shown]
[im 71/353  lung]
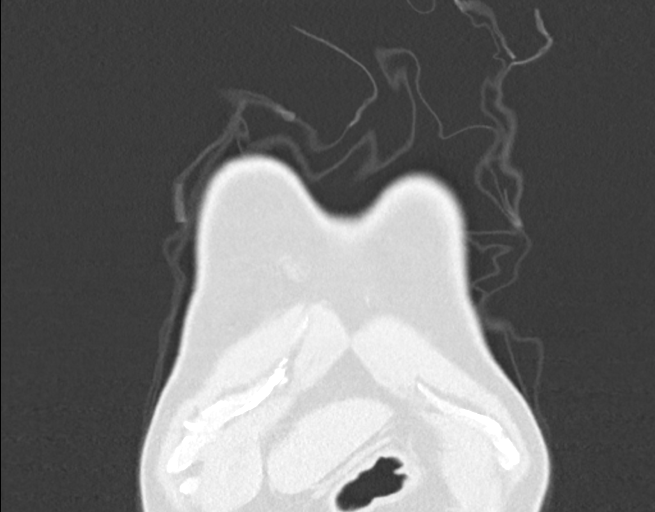
[im 141/353  lung]
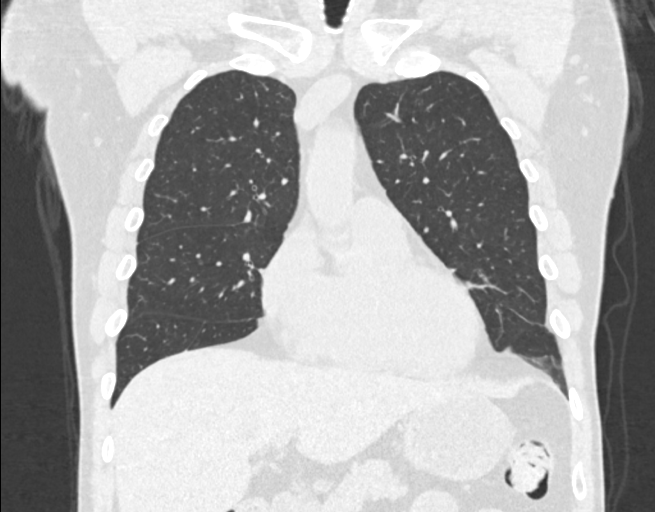
[im 212/353  lung]
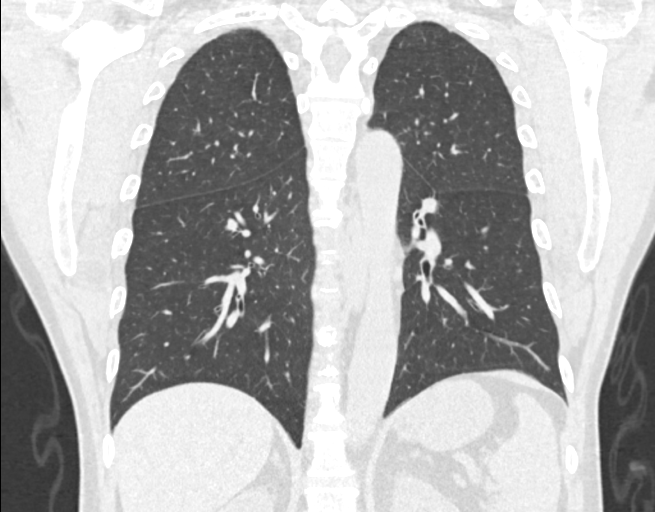

[15 of 40 positions shown; findings below may reference images not displayed]

FINDINGS: Cardiovascular: Normal aortic caliber. Normal heart size, without
pericardial effusion. Lad coronary artery calcification

Mediastinum/Nodes: No mediastinal or definite hilar adenopathy,
given limitations of unenhanced CT.

Lungs/Pleura: No pleural fluid. Mild centrilobular emphysema. The
lingular density is decreased, currently measuring volume derived
equivalent diameter 5.1 mm versus volume derived equivalent diameter
7.4 mm on the prior exam.

Other smaller pulmonary nodules are not significantly changed. The
next largest nodule is a non solid nodule in the right upper lobe at
volume derived equivalent diameter 4.3 mm.

Upper Abdomen: Normal imaged portions of the liver, spleen, stomach,
pancreas, gallbladder, adrenal glands, kidneys.

Musculoskeletal: No acute osseous abnormality.
IMPRESSION: 1. Lung-RADS 2, benign appearance or behavior. Continue annual
screening with low-dose chest CT without contrast in 12 months.
2. Emphysema ([id number]). Coronary artery atherosclerosis.

## 2022-10-27 DIAGNOSIS — D225 Melanocytic nevi of trunk: Secondary | ICD-10-CM | POA: Diagnosis not present

## 2022-10-27 DIAGNOSIS — L918 Other hypertrophic disorders of the skin: Secondary | ICD-10-CM | POA: Diagnosis not present

## 2022-10-27 DIAGNOSIS — L821 Other seborrheic keratosis: Secondary | ICD-10-CM | POA: Diagnosis not present

## 2022-10-27 DIAGNOSIS — D1801 Hemangioma of skin and subcutaneous tissue: Secondary | ICD-10-CM | POA: Diagnosis not present

## 2022-10-27 DIAGNOSIS — L578 Other skin changes due to chronic exposure to nonionizing radiation: Secondary | ICD-10-CM | POA: Diagnosis not present

## 2023-04-13 DIAGNOSIS — E7800 Pure hypercholesterolemia, unspecified: Secondary | ICD-10-CM | POA: Diagnosis not present

## 2023-04-20 DIAGNOSIS — Z0000 Encounter for general adult medical examination without abnormal findings: Secondary | ICD-10-CM | POA: Diagnosis not present

## 2023-04-20 DIAGNOSIS — J309 Allergic rhinitis, unspecified: Secondary | ICD-10-CM | POA: Diagnosis not present

## 2023-04-20 DIAGNOSIS — J439 Emphysema, unspecified: Secondary | ICD-10-CM | POA: Diagnosis not present

## 2023-04-20 DIAGNOSIS — M79671 Pain in right foot: Secondary | ICD-10-CM | POA: Diagnosis not present

## 2023-04-20 DIAGNOSIS — I700 Atherosclerosis of aorta: Secondary | ICD-10-CM | POA: Diagnosis not present

## 2023-04-20 DIAGNOSIS — I480 Paroxysmal atrial fibrillation: Secondary | ICD-10-CM | POA: Diagnosis not present

## 2023-04-20 DIAGNOSIS — J45909 Unspecified asthma, uncomplicated: Secondary | ICD-10-CM | POA: Diagnosis not present

## 2023-04-20 DIAGNOSIS — I2510 Atherosclerotic heart disease of native coronary artery without angina pectoris: Secondary | ICD-10-CM | POA: Diagnosis not present

## 2023-04-20 DIAGNOSIS — R911 Solitary pulmonary nodule: Secondary | ICD-10-CM | POA: Diagnosis not present

## 2023-04-20 DIAGNOSIS — B351 Tinea unguium: Secondary | ICD-10-CM | POA: Diagnosis not present

## 2023-04-20 DIAGNOSIS — Z23 Encounter for immunization: Secondary | ICD-10-CM | POA: Diagnosis not present

## 2023-05-14 DIAGNOSIS — J208 Acute bronchitis due to other specified organisms: Secondary | ICD-10-CM | POA: Diagnosis not present

## 2023-05-14 DIAGNOSIS — B9689 Other specified bacterial agents as the cause of diseases classified elsewhere: Secondary | ICD-10-CM | POA: Diagnosis not present

## 2023-06-15 DIAGNOSIS — I340 Nonrheumatic mitral (valve) insufficiency: Secondary | ICD-10-CM | POA: Diagnosis not present

## 2023-06-15 DIAGNOSIS — I480 Paroxysmal atrial fibrillation: Secondary | ICD-10-CM | POA: Diagnosis not present

## 2023-06-15 DIAGNOSIS — I361 Nonrheumatic tricuspid (valve) insufficiency: Secondary | ICD-10-CM | POA: Diagnosis not present

## 2023-06-15 DIAGNOSIS — I2510 Atherosclerotic heart disease of native coronary artery without angina pectoris: Secondary | ICD-10-CM | POA: Diagnosis not present

## 2023-06-21 ENCOUNTER — Other Ambulatory Visit: Payer: Self-pay | Admitting: Cardiology

## 2023-06-21 DIAGNOSIS — I2510 Atherosclerotic heart disease of native coronary artery without angina pectoris: Secondary | ICD-10-CM

## 2023-06-28 ENCOUNTER — Ambulatory Visit: Payer: Self-pay | Admitting: Cardiology

## 2023-07-16 DIAGNOSIS — R350 Frequency of micturition: Secondary | ICD-10-CM | POA: Diagnosis not present

## 2023-07-27 DIAGNOSIS — U071 COVID-19: Secondary | ICD-10-CM | POA: Diagnosis not present

## 2023-11-02 DIAGNOSIS — L603 Nail dystrophy: Secondary | ICD-10-CM | POA: Diagnosis not present

## 2023-11-02 DIAGNOSIS — D239 Other benign neoplasm of skin, unspecified: Secondary | ICD-10-CM | POA: Diagnosis not present

## 2023-11-02 DIAGNOSIS — L918 Other hypertrophic disorders of the skin: Secondary | ICD-10-CM | POA: Diagnosis not present

## 2023-11-02 DIAGNOSIS — D225 Melanocytic nevi of trunk: Secondary | ICD-10-CM | POA: Diagnosis not present

## 2023-11-02 DIAGNOSIS — L814 Other melanin hyperpigmentation: Secondary | ICD-10-CM | POA: Diagnosis not present

## 2023-11-02 DIAGNOSIS — D1801 Hemangioma of skin and subcutaneous tissue: Secondary | ICD-10-CM | POA: Diagnosis not present

## 2023-11-02 DIAGNOSIS — L821 Other seborrheic keratosis: Secondary | ICD-10-CM | POA: Diagnosis not present

## 2023-11-02 DIAGNOSIS — L578 Other skin changes due to chronic exposure to nonionizing radiation: Secondary | ICD-10-CM | POA: Diagnosis not present

## 2024-01-04 DIAGNOSIS — I340 Nonrheumatic mitral (valve) insufficiency: Secondary | ICD-10-CM | POA: Diagnosis not present

## 2024-01-04 DIAGNOSIS — I2510 Atherosclerotic heart disease of native coronary artery without angina pectoris: Secondary | ICD-10-CM | POA: Diagnosis not present

## 2024-01-04 DIAGNOSIS — I361 Nonrheumatic tricuspid (valve) insufficiency: Secondary | ICD-10-CM | POA: Diagnosis not present

## 2024-01-04 DIAGNOSIS — I480 Paroxysmal atrial fibrillation: Secondary | ICD-10-CM | POA: Diagnosis not present

## 2024-05-16 DIAGNOSIS — E7800 Pure hypercholesterolemia, unspecified: Secondary | ICD-10-CM | POA: Diagnosis not present

## 2024-05-23 DIAGNOSIS — Z0000 Encounter for general adult medical examination without abnormal findings: Secondary | ICD-10-CM | POA: Diagnosis not present

## 2024-05-23 DIAGNOSIS — I2510 Atherosclerotic heart disease of native coronary artery without angina pectoris: Secondary | ICD-10-CM | POA: Diagnosis not present

## 2024-05-23 DIAGNOSIS — I700 Atherosclerosis of aorta: Secondary | ICD-10-CM | POA: Diagnosis not present

## 2024-05-23 DIAGNOSIS — J309 Allergic rhinitis, unspecified: Secondary | ICD-10-CM | POA: Diagnosis not present

## 2024-05-23 DIAGNOSIS — J439 Emphysema, unspecified: Secondary | ICD-10-CM | POA: Diagnosis not present

## 2024-05-23 DIAGNOSIS — J45909 Unspecified asthma, uncomplicated: Secondary | ICD-10-CM | POA: Diagnosis not present

## 2024-05-23 DIAGNOSIS — I480 Paroxysmal atrial fibrillation: Secondary | ICD-10-CM | POA: Diagnosis not present

## 2024-05-23 DIAGNOSIS — M25512 Pain in left shoulder: Secondary | ICD-10-CM | POA: Diagnosis not present

## 2024-05-23 DIAGNOSIS — Z23 Encounter for immunization: Secondary | ICD-10-CM | POA: Diagnosis not present

## 2024-05-23 DIAGNOSIS — R918 Other nonspecific abnormal finding of lung field: Secondary | ICD-10-CM | POA: Diagnosis not present

## 2024-06-20 ENCOUNTER — Other Ambulatory Visit: Payer: Self-pay

## 2024-06-20 DIAGNOSIS — I2510 Atherosclerotic heart disease of native coronary artery without angina pectoris: Secondary | ICD-10-CM

## 2024-06-22 MED ORDER — ROSUVASTATIN CALCIUM 10 MG PO TABS: 10.0000 mg | ORAL_TABLET | Freq: Every day | ORAL | 0 refills | Status: DC

## 2024-07-18 ENCOUNTER — Other Ambulatory Visit: Payer: Self-pay | Admitting: Cardiology

## 2024-07-18 DIAGNOSIS — I2510 Atherosclerotic heart disease of native coronary artery without angina pectoris: Secondary | ICD-10-CM

## 2024-07-22 ENCOUNTER — Other Ambulatory Visit: Payer: Self-pay | Admitting: Cardiology

## 2024-07-22 DIAGNOSIS — I2510 Atherosclerotic heart disease of native coronary artery without angina pectoris: Secondary | ICD-10-CM

## 2024-08-13 ENCOUNTER — Other Ambulatory Visit: Payer: Self-pay | Admitting: Cardiology

## 2024-08-13 DIAGNOSIS — I2510 Atherosclerotic heart disease of native coronary artery without angina pectoris: Secondary | ICD-10-CM

## 2024-08-23 ENCOUNTER — Other Ambulatory Visit: Payer: Self-pay | Admitting: Cardiology

## 2024-08-23 DIAGNOSIS — I2510 Atherosclerotic heart disease of native coronary artery without angina pectoris: Secondary | ICD-10-CM

## 2024-08-23 NOTE — Telephone Encounter (Signed)
 In accordance with refill protocols, please review and address the following requirements before this medication refill can be authorized:  Labs

## 2024-08-23 NOTE — Telephone Encounter (Signed)
 Unable to refill, not seen by me in over 2 years.  Defer refill to PCP, or arrange nonurgent follow-up visit with us .  Thanks MJP

## 2024-08-28 NOTE — Telephone Encounter (Signed)
 Spoke with the patient. He stated that he is already seeing a cardiologist at [facility] and will contact them to refill all of his heart medications.
